# Patient Record
Sex: Male | Born: 1970 | Race: White | Hispanic: No | Smoking: Never smoker
Health system: Southern US, Community
[De-identification: ages and names within clinical notes are randomized; demographics above are authoritative.]

## PROBLEM LIST (undated history)

## (undated) DIAGNOSIS — E7800 Pure hypercholesterolemia, unspecified: Secondary | ICD-10-CM

## (undated) DIAGNOSIS — R748 Abnormal levels of other serum enzymes: Secondary | ICD-10-CM

## (undated) DIAGNOSIS — Z87442 Personal history of urinary calculi: Secondary | ICD-10-CM

## (undated) DIAGNOSIS — F32A Depression, unspecified: Secondary | ICD-10-CM

## (undated) DIAGNOSIS — R7303 Prediabetes: Secondary | ICD-10-CM

## (undated) DIAGNOSIS — R7989 Other specified abnormal findings of blood chemistry: Secondary | ICD-10-CM

## (undated) DIAGNOSIS — N200 Calculus of kidney: Secondary | ICD-10-CM

## (undated) DIAGNOSIS — J189 Pneumonia, unspecified organism: Secondary | ICD-10-CM

## (undated) DIAGNOSIS — F329 Major depressive disorder, single episode, unspecified: Secondary | ICD-10-CM

## (undated) DIAGNOSIS — G4730 Sleep apnea, unspecified: Secondary | ICD-10-CM

## (undated) DIAGNOSIS — L409 Psoriasis, unspecified: Secondary | ICD-10-CM

## (undated) DIAGNOSIS — F419 Anxiety disorder, unspecified: Secondary | ICD-10-CM

## (undated) DIAGNOSIS — I10 Essential (primary) hypertension: Secondary | ICD-10-CM

## (undated) HISTORY — PX: CHOLECYSTECTOMY: SHX55

## (undated) HISTORY — DX: Pure hypercholesterolemia, unspecified: E78.00

## (undated) HISTORY — DX: Psoriasis, unspecified: L40.9

## (undated) HISTORY — DX: Depression, unspecified: F32.A

## (undated) HISTORY — PX: LITHOTRIPSY: SUR834

## (undated) HISTORY — DX: Sleep apnea, unspecified: G47.30

## (undated) HISTORY — DX: Calculus of kidney: N20.0

## (undated) HISTORY — PX: OTHER SURGICAL HISTORY: SHX169

## (undated) HISTORY — DX: Major depressive disorder, single episode, unspecified: F32.9

## (undated) HISTORY — DX: Other specified abnormal findings of blood chemistry: R79.89

---

## 1998-04-21 ENCOUNTER — Ambulatory Visit (HOSPITAL_COMMUNITY): Admission: RE | Admit: 1998-04-21 | Discharge: 1998-04-21 | Payer: Self-pay | Admitting: Gynecology

## 2001-01-21 ENCOUNTER — Encounter: Payer: Self-pay | Admitting: Gastroenterology

## 2001-01-21 ENCOUNTER — Ambulatory Visit (HOSPITAL_COMMUNITY): Admission: RE | Admit: 2001-01-21 | Discharge: 2001-01-21 | Payer: Self-pay | Admitting: Gastroenterology

## 2003-01-22 ENCOUNTER — Encounter: Payer: Self-pay | Admitting: Family Medicine

## 2003-01-22 ENCOUNTER — Emergency Department (HOSPITAL_COMMUNITY): Admission: RE | Admit: 2003-01-22 | Discharge: 2003-01-23 | Payer: Self-pay | Admitting: Family Medicine

## 2003-03-11 ENCOUNTER — Ambulatory Visit (HOSPITAL_BASED_OUTPATIENT_CLINIC_OR_DEPARTMENT_OTHER): Admission: RE | Admit: 2003-03-11 | Discharge: 2003-03-11 | Payer: Self-pay | Admitting: Urology

## 2003-06-04 ENCOUNTER — Encounter: Admission: RE | Admit: 2003-06-04 | Discharge: 2003-09-02 | Payer: Self-pay | Admitting: Psychology

## 2004-10-03 ENCOUNTER — Ambulatory Visit: Payer: Self-pay

## 2006-04-17 HISTORY — PX: URETEROLITHOTOMY: SHX71

## 2007-03-23 ENCOUNTER — Emergency Department (HOSPITAL_COMMUNITY): Admission: EM | Admit: 2007-03-23 | Discharge: 2007-03-23 | Payer: Self-pay | Admitting: Emergency Medicine

## 2007-03-28 ENCOUNTER — Ambulatory Visit (HOSPITAL_COMMUNITY): Admission: RE | Admit: 2007-03-28 | Discharge: 2007-03-28 | Payer: Self-pay | Admitting: Urology

## 2007-10-29 ENCOUNTER — Emergency Department (HOSPITAL_COMMUNITY): Admission: EM | Admit: 2007-10-29 | Discharge: 2007-10-29 | Payer: Self-pay | Admitting: Emergency Medicine

## 2010-08-08 ENCOUNTER — Emergency Department (HOSPITAL_COMMUNITY)
Admission: EM | Admit: 2010-08-08 | Discharge: 2010-08-08 | Disposition: A | Discharge status: Home / Self Care | Payer: Managed Care, Other (non HMO) | Attending: Emergency Medicine | Admitting: Emergency Medicine

## 2010-08-08 DIAGNOSIS — M545 Low back pain, unspecified: Secondary | ICD-10-CM | POA: Insufficient documentation

## 2010-08-08 DIAGNOSIS — M62838 Other muscle spasm: Secondary | ICD-10-CM | POA: Insufficient documentation

## 2010-08-08 DIAGNOSIS — X58XXXA Exposure to other specified factors, initial encounter: Secondary | ICD-10-CM | POA: Insufficient documentation

## 2010-08-08 DIAGNOSIS — T148XXA Other injury of unspecified body region, initial encounter: Secondary | ICD-10-CM | POA: Insufficient documentation

## 2010-08-08 DIAGNOSIS — G4730 Sleep apnea, unspecified: Secondary | ICD-10-CM | POA: Insufficient documentation

## 2010-08-08 DIAGNOSIS — Z87442 Personal history of urinary calculi: Secondary | ICD-10-CM | POA: Insufficient documentation

## 2010-08-08 DIAGNOSIS — E7800 Pure hypercholesterolemia, unspecified: Secondary | ICD-10-CM | POA: Insufficient documentation

## 2010-08-30 ENCOUNTER — Encounter: Payer: Self-pay | Admitting: Cardiovascular Disease

## 2010-09-02 NOTE — Op Note (Signed)
NAME:  Jonathan Ballard, Jonathan Ballard                      ACCOUNT NO.:  [id number]   MEDICAL RECORD NO.:  [id number]                   PATIENT TYPE:  AMB   LOCATION:  [facility]                                 FACILITY:  [facility]   PHYSICIAN:  Bertram Millard. Dahlstedt, M.D.          DATE OF BIRTH:  10-16-1970   DATE OF PROCEDURE:  03/11/2003  DATE OF DISCHARGE:                                 OPERATIVE REPORT   PREOPERATIVE DIAGNOSIS:  Right ureteral calculus.   POSTOPERATIVE DIAGNOSIS:  Right ureteral calculus.   PRINCIPAL PROCEDURE:  Right ureteroscopic stone extraction.   SURGEON:  Bertram Millard. Dahlstedt, M.D.   FIRST ASSISTANT:  Susanne Borders, M.D.   ANESTHESIA:  General with LMA.   COMPLICATIONS:  None.   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  He has had intermittent right flank pain.  Over  the past week or so this pain has been getting worse.  He has been on  Vicodin almost every day.  He recently presented to my office and requested  that we do something about this stone.  We had talked about treatment  options including lithotripsy versus ureteroscopic stone extraction.  This  is a distal ureteral stone, and I feel it is more amenable to extraction  rather than lithotripsy.  Risks and complications of this procedure were  discussed with the patient.  He understands these and desires to proceed.   DESCRIPTION OF PROCEDURE:  The patient was administered preoperative IV  antibiotics and taken to the operating room, where a general anesthetic was  administered using the LMA.  He was placed in the dorsal lithotomy position.  Genitalia and perineum were cleansed and draped.  A 6 French ureteroscope  was passed directly through his urethra and into his right ureter.  The  scope was advanced through the ureteral orifice up to the stone, which was  then encountered, grasped with a nitinol basket, and extracted.  The ureter  was then again  inspected with the ureteroscope.  No further stones were  seen.  There was no significant ureteral spasm, and it was thought that we  would not leave a stent in place.  At this point the bladder was drained,  the scope removed.  The patient tolerated the procedure well.  He was  awakened and taken to the PACU in stable condition.   He will follow up in approximately one week.  Discharge medications include  the narcotic the patient already has at home plus Keflex.                                               Bertram Millard. Dahlstedt, M.D.    SMD/MEDQ  D:  03/11/2003  T:  03/11/2003  Job:  [id number]

## 2010-11-02 ENCOUNTER — Ambulatory Visit: Payer: Managed Care, Other (non HMO) | Admitting: Cardiovascular Disease

## 2010-11-07 ENCOUNTER — Encounter: Payer: Self-pay | Admitting: *Deleted

## 2010-11-07 ENCOUNTER — Encounter: Payer: Self-pay | Admitting: Cardiovascular Disease

## 2010-11-08 ENCOUNTER — Ambulatory Visit (INDEPENDENT_AMBULATORY_CARE_PROVIDER_SITE_OTHER): Payer: Managed Care, Other (non HMO) | Admitting: Cardiovascular Disease

## 2010-11-08 ENCOUNTER — Encounter: Payer: Self-pay | Admitting: Cardiovascular Disease

## 2010-11-08 VITALS — BP 121/69 | HR 80 | Resp 14

## 2010-11-08 DIAGNOSIS — E785 Hyperlipidemia, unspecified: Secondary | ICD-10-CM

## 2010-11-08 DIAGNOSIS — E786 Lipoprotein deficiency: Secondary | ICD-10-CM | POA: Insufficient documentation

## 2010-11-08 NOTE — Patient Instructions (Signed)
Your physician has requested that you have cardiac CT. Cardiac computed tomography (CT) is a painless test that uses an x-ray machine to take clear, detailed pictures of your heart. For further information please visit www.cardiosmart.org. Please follow instruction sheet as given.   

## 2010-11-08 NOTE — Assessment & Plan Note (Signed)
Increase aerobic activity.  Continue statin.  Refer to CETP trial for raising HDL.  Will see if insurance covers calcium scoring.  Continue Atkins diet

## 2010-11-08 NOTE — Progress Notes (Signed)
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  On statin for long time.  Rash to Niacin. HDL has been as low as 9 and usually in mid 20s now.  Has good understanding of diets.  Currently on induction phase of Atkins.  Has had kidney stones with [facility] before.  Fairly sedentary.  Discussed best way to increase HDL is to exercise.  No SSCP, palpitations, dyspnea or syncope.  Has had ETT at age 65 and 58 that were normal.  Would be a good candidate for CETP inhibitor trial. Spoke with Weston Brass or Pharm D and she will talk to the Pharm D at [place] [facility] who enrolls [place] patient in these trials.  No history of liver abnormalities.    Did discuss possiblility of calcium score to further assess possibility of CAD.  If his calcium score were high he may be more inclined to enter a CETP research trial  Labs:  08/31/10  LDL-P 1945 (<1000), HDL 30, Trigl 97 TC 145 LDL -C 96  ROS: Denies fever, malais, weight loss, blurry vision, decreased visual acuity, cough, sputum, SOB, hemoptysis, pleuritic pain, palpitaitons, heartburn, abdominal pain, melena, lower extremity edema, claudication, or rash.  All other systems reviewed and negative   General: Affect appropriate Healthy:  appears stated age HEENT: normal Neck supple with no adenopathy JVP normal no bruits no thyromegaly Lungs clear with no wheezing and good diaphragmatic motion Heart:  S1/S2 no murmur,rub, gallop or click PMI normal Abdomen: benighn, BS positve, no tenderness, no AAA no bruit.  No HSM or HJR Distal pulses intact with no bruits No edema Neuro non-focal Skin warm and dry Psoriatic lesions on legs No muscular weakness  Medications Current Outpatient Prescriptions  Medication Sig Dispense Refill  . aspirin 81 MG tablet Take 81 mg by mouth daily.        . Cholecalciferol (VITAMIN D3) 1000 UNITS CAPS Take 1 tablet by mouth daily.        Marland Kitchen docusate sodium (COLACE) 100 MG capsule Take 100 mg by mouth 2 (two)  times daily.        Marland Kitchen omega-3 acid ethyl esters (LOVAZA) 1 G capsule 3 tabs po qd       . rosuvastatin (CRESTOR) 20 MG tablet Take 20 mg by mouth daily.        Marland Kitchen zolpidem (AMBIEN) 10 MG tablet Take 10 mg by mouth at bedtime as needed.          Allergies Niacin and related; Penicillins; and Zithromax  Family History: No family history on file.  Social History: History   Social History  . Marital Status: Married    Spouse Name: N/A    Number of Children: N/A  . Years of Education: N/A   Occupational History  . Not on file.   Social History Main Topics  . Smoking status: Never Smoker   . Smokeless tobacco: Not on file  . Alcohol Use: Not on file  . Drug Use: Not on file  . Sexually Active: Not on file   Other Topics Concern  . Not on file   Social History Narrative  . No narrative on file    Electrocardiogram:  NSR 83 LAFB  Assessment and Plan

## 2010-11-14 ENCOUNTER — Ambulatory Visit (HOSPITAL_COMMUNITY)
Admission: RE | Admit: 2010-11-14 | Discharge: 2010-11-14 | Disposition: A | Discharge status: Home / Self Care | Payer: Managed Care, Other (non HMO) | Source: Ambulatory Visit | Attending: Cardiovascular Disease | Admitting: Cardiovascular Disease

## 2010-11-14 ENCOUNTER — Encounter (HOSPITAL_COMMUNITY): Payer: Self-pay

## 2010-11-14 DIAGNOSIS — E786 Lipoprotein deficiency: Secondary | ICD-10-CM

## 2010-11-14 DIAGNOSIS — E785 Hyperlipidemia, unspecified: Secondary | ICD-10-CM | POA: Insufficient documentation

## 2010-12-06 ENCOUNTER — Encounter: Payer: Self-pay | Admitting: Cardiovascular Disease

## 2011-01-12 LAB — URINALYSIS, ROUTINE W REFLEX MICROSCOPIC
Bilirubin Urine: NEGATIVE
Ketones, ur: NEGATIVE
Nitrite: POSITIVE — AB
Protein, ur: NEGATIVE
Specific Gravity, Urine: 1.016

## 2011-01-23 LAB — DIFFERENTIAL
Basophils Absolute: 0
Basophils Relative: 0
Eosinophils Absolute: 0 — ABNORMAL LOW
Lymphs Abs: 0.4 — ABNORMAL LOW
Monocytes Relative: 5
Neutrophils Relative %: 91 — ABNORMAL HIGH

## 2011-01-23 LAB — BASIC METABOLIC PANEL
CO2: 26
Chloride: 106
Creatinine, Ser: 1.19
GFR calc non Af Amer: >60
Sodium: 139

## 2011-01-23 LAB — URINE MICROSCOPIC-ADD ON

## 2011-01-23 LAB — URINE CULTURE

## 2011-01-23 LAB — URINALYSIS, ROUTINE W REFLEX MICROSCOPIC
Glucose, UA: NEGATIVE
Leukocytes, UA: NEGATIVE
Nitrite: NEGATIVE
Specific Gravity, Urine: 1.027
pH: 5

## 2011-01-23 LAB — CBC
HCT: 42.1
MCV: 88.7
RDW: 12.8

## 2011-05-05 ENCOUNTER — Ambulatory Visit (INDEPENDENT_AMBULATORY_CARE_PROVIDER_SITE_OTHER): Payer: Managed Care, Other (non HMO)

## 2011-05-05 DIAGNOSIS — K5900 Constipation, unspecified: Secondary | ICD-10-CM

## 2011-05-05 DIAGNOSIS — R1084 Generalized abdominal pain: Secondary | ICD-10-CM

## 2011-05-26 ENCOUNTER — Telehealth: Payer: Self-pay

## 2011-05-26 NOTE — Telephone Encounter (Signed)
.  [facility] PT STATES HE IS TO GO SEE AN UROLOGIST ON Monday AND THEY ARE IN NEED OF HIS XRAYS. FORGOT THE NAME OF THE PLACE, BUT HE THINK IT'S ON [address]. PLEASE CALL PT AT [phone]

## 2011-05-26 NOTE — Telephone Encounter (Signed)
X-rays copied for pick up. Patient notified.

## 2011-05-31 ENCOUNTER — Other Ambulatory Visit: Payer: Self-pay | Admitting: Urology

## 2011-06-14 ENCOUNTER — Encounter (HOSPITAL_COMMUNITY): Payer: Self-pay | Admitting: *Deleted

## 2011-06-14 NOTE — Progress Notes (Signed)
Pt reminded no aspirin products 4 days prior to ESWL and to take laxative 06/18/11 pm

## 2011-06-16 ENCOUNTER — Encounter (HOSPITAL_COMMUNITY): Payer: Self-pay | Admitting: Pharmacy Technician

## 2011-06-18 NOTE — H&P (Signed)
Urology Admission H&P  Chief Complaint: Kidney stone    History of Present Illness: 41 year old male originally presented in mid January with a 10 day history of left flank pain. He was found to have a 5 mm left mid ureteral stone, which at his last visit approximately 2 weeks ago had not progressed distally despite MET. He presents now for ESL of the stone.  Past Medical History  Diagnosis Date  . Hypercholesteremia   . Kidney stone   . Sleep apnea     uses C-PAP   Past Surgical History  Procedure Date  . Lithotripsy   . Cholecystectomy   . Ureterolithotomy 2008    Home Medications:  No prescriptions prior to admission   Allergies:  Allergies  Allergen Reactions  . Zithromax (Azithromycin Dihydrate) Other (See Comments)    Reaction=tongue swells  . Niacin And Related Rash  . Niaspan (Niacin (Antihyperlipidemic)) Rash  . Penicillins Rash    No family history on file. Social History:  reports that he has never smoked. He does not have any smokeless tobacco history on file. His alcohol and drug histories not on file.  Review of Systems  Gastrointestinal: Positive for constipation.       Bloating  All other systems reviewed and are negative.    Physical Exam:  Vital signs in last 24 hours:   Physical Exam Physical Exam Constitutional: Well nourished and well developed . No acute distress.  ENT:. The ears and nose are normal in appearance.  Neck: The appearance of the neck is normal and no neck mass is present.  Pulmonary: No respiratory distress and normal respiratory rhythm and effort . No accessory muscle use.  Abdomen: The abdomen is rounded. No masses are palpated. Mild tenderness in the LLQ is present. mild left CVA tenderness. No hernias are palpable. No hepatosplenomegaly noted.  Lymphatics: The femoral and inguinal nodes are not enlarged or tender.  Skin: Normal skin turgor, no visible rash and no visible skin lesions.     Laboratory Data:  No results  found for this or any previous visit (from the past 24 hour(s)). No results found for this or any previous visit (from the past 240 hour(s)). Creatinine: No results found for this basename: CREATININE:7 in the last 168 hours Baseline Creatinine:  Impression/Assessment:  Persistent 5 mm left mid ureteral stone.  Plan:  ESL of left mid ureteral stone  Masin Shatto M 06/18/2011, 7:10 PM

## 2011-06-19 ENCOUNTER — Ambulatory Visit (HOSPITAL_COMMUNITY)
Admission: RE | Admit: 2011-06-19 | Discharge: 2011-06-19 | Disposition: A | Discharge status: Home / Self Care | Payer: Managed Care, Other (non HMO) | Source: Ambulatory Visit | Attending: Urology | Admitting: Urology

## 2011-06-19 ENCOUNTER — Encounter (HOSPITAL_COMMUNITY)
Admission: RE | Disposition: A | Discharge status: Home / Self Care | Payer: Self-pay | Source: Ambulatory Visit | Attending: Urology

## 2011-06-19 ENCOUNTER — Encounter (HOSPITAL_COMMUNITY): Payer: Self-pay | Admitting: *Deleted

## 2011-06-19 ENCOUNTER — Ambulatory Visit (HOSPITAL_COMMUNITY): Payer: Managed Care, Other (non HMO)

## 2011-06-19 DIAGNOSIS — K5900 Constipation, unspecified: Secondary | ICD-10-CM | POA: Insufficient documentation

## 2011-06-19 DIAGNOSIS — E7800 Pure hypercholesterolemia, unspecified: Secondary | ICD-10-CM | POA: Insufficient documentation

## 2011-06-19 DIAGNOSIS — N201 Calculus of ureter: Secondary | ICD-10-CM | POA: Insufficient documentation

## 2011-06-19 SURGERY — LITHOTRIPSY, ESWL
Anesthesia: LOCAL | Laterality: Left

## 2011-06-19 MED ORDER — SODIUM CHLORIDE 0.9 % IJ SOLN: 3.0000 mL | INTRAMUSCULAR | Status: DC | PRN

## 2011-06-19 MED ORDER — DIAZEPAM 5 MG PO TABS
10.0000 mg | ORAL_TABLET | ORAL | Status: AC
  Administered 2011-06-19: 10 mg via ORAL

## 2011-06-19 MED ORDER — DEXTROSE-NACL 5-0.45 % IV SOLN
INTRAVENOUS | Status: DC
  Administered 2011-06-19: 08:00:00 via INTRAVENOUS

## 2011-06-19 MED ORDER — ACETAMINOPHEN 325 MG PO TABS: 650.0000 mg | ORAL_TABLET | ORAL | Status: DC | PRN

## 2011-06-19 MED ORDER — ACETAMINOPHEN 650 MG RE SUPP
650.0000 mg | RECTAL | Status: DC | PRN
  Filled 2011-06-19: qty 1

## 2011-06-19 MED ORDER — DIPHENHYDRAMINE HCL 25 MG PO CAPS
ORAL_CAPSULE | ORAL | Status: AC
  Administered 2011-06-19: 25 mg via ORAL
  Filled 2011-06-19: qty 1

## 2011-06-19 MED ORDER — ONDANSETRON HCL 4 MG/2ML IJ SOLN: 4.0000 mg | Freq: Four times a day (QID) | INTRAMUSCULAR | Status: DC | PRN

## 2011-06-19 MED ORDER — OXYCODONE HCL 5 MG PO TABS: 5.0000 mg | ORAL_TABLET | ORAL | Status: DC | PRN

## 2011-06-19 MED ORDER — MORPHINE SULFATE 10 MG/ML IJ SOLN: 2.0000 mg | INTRAMUSCULAR | Status: DC | PRN

## 2011-06-19 MED ORDER — SODIUM CHLORIDE 0.9 % IV SOLN: 250.0000 mL | INTRAVENOUS | Status: DC | PRN

## 2011-06-19 MED ORDER — SODIUM CHLORIDE 0.9 % IJ SOLN: 3.0000 mL | Freq: Two times a day (BID) | INTRAMUSCULAR | Status: DC

## 2011-06-19 MED ORDER — DIPHENHYDRAMINE HCL 25 MG PO CAPS
25.0000 mg | ORAL_CAPSULE | ORAL | Status: AC
  Administered 2011-06-19: 25 mg via ORAL

## 2011-06-19 MED ORDER — DIAZEPAM 5 MG PO TABS
ORAL_TABLET | ORAL | Status: AC
  Administered 2011-06-19: 10 mg via ORAL
  Filled 2011-06-19: qty 2

## 2011-06-19 NOTE — Op Note (Signed)
See [facility] OP note scanned into chart. 

## 2011-06-19 NOTE — Progress Notes (Signed)
Left flank reddened with out breakdown post lithotripsy

## 2011-06-20 ENCOUNTER — Encounter (HOSPITAL_COMMUNITY): Payer: Self-pay

## 2011-07-24 ENCOUNTER — Other Ambulatory Visit: Payer: Self-pay | Admitting: Emergency Medicine

## 2011-09-03 ENCOUNTER — Other Ambulatory Visit: Payer: Self-pay | Admitting: Emergency Medicine

## 2011-09-12 ENCOUNTER — Ambulatory Visit (INDEPENDENT_AMBULATORY_CARE_PROVIDER_SITE_OTHER): Payer: Managed Care, Other (non HMO) | Admitting: Emergency Medicine

## 2011-09-12 VITALS — BP 110/70 | HR 62 | Temp 98.0°F | Resp 16 | Ht 64.0 in | Wt 174.4 lb

## 2011-09-12 DIAGNOSIS — E785 Hyperlipidemia, unspecified: Secondary | ICD-10-CM | POA: Insufficient documentation

## 2011-09-12 DIAGNOSIS — L408 Other psoriasis: Secondary | ICD-10-CM

## 2011-09-12 DIAGNOSIS — G4700 Insomnia, unspecified: Secondary | ICD-10-CM

## 2011-09-12 DIAGNOSIS — E782 Mixed hyperlipidemia: Secondary | ICD-10-CM

## 2011-09-12 DIAGNOSIS — Z0000 Encounter for general adult medical examination without abnormal findings: Secondary | ICD-10-CM

## 2011-09-12 DIAGNOSIS — M109 Gout, unspecified: Secondary | ICD-10-CM | POA: Insufficient documentation

## 2011-09-12 DIAGNOSIS — R6882 Decreased libido: Secondary | ICD-10-CM

## 2011-09-12 DIAGNOSIS — N529 Male erectile dysfunction, unspecified: Secondary | ICD-10-CM

## 2011-09-12 DIAGNOSIS — N200 Calculus of kidney: Secondary | ICD-10-CM

## 2011-09-12 LAB — COMPREHENSIVE METABOLIC PANEL
AST: 25 U/L (ref 0–37)
Albumin: 4.4 g/dL (ref 3.5–5.2)
BUN: 14 mg/dL (ref 6–23)
Calcium: 9 mg/dL (ref 8.4–10.5)
Chloride: 106 mEq/L (ref 96–112)
Glucose, Bld: 79 mg/dL (ref 70–99)
Potassium: 4.5 mEq/L (ref 3.5–5.3)
Sodium: 140 mEq/L (ref 135–145)
Total Protein: 6.7 g/dL (ref 6.0–8.3)

## 2011-09-12 LAB — CBC WITH DIFFERENTIAL/PLATELET
Basophils Absolute: 0 10*3/uL (ref 0.0–0.1)
HCT: 43 % (ref 39.0–52.0)
Hemoglobin: 14.3 g/dL (ref 13.0–17.0)
Lymphocytes Relative: 21 % (ref 12–46)
Monocytes Absolute: 0.3 10*3/uL (ref 0.1–1.0)
Monocytes Relative: 6 % (ref 3–12)
Neutro Abs: 3.7 10*3/uL (ref 1.7–7.7)
Neutrophils Relative %: 70 % (ref 43–77)
WBC: 5.3 10*3/uL (ref 4.0–10.5)

## 2011-09-12 LAB — POCT UA - MICROSCOPIC ONLY

## 2011-09-12 LAB — POCT URINALYSIS DIPSTICK
Bilirubin, UA: NEGATIVE
Glucose, UA: NEGATIVE
Ketones, UA: NEGATIVE
Leukocytes, UA: NEGATIVE
Nitrite, UA: NEGATIVE

## 2011-09-12 MED ORDER — OMEGA-3-ACID ETHYL ESTERS 1 G PO CAPS: 1.0000 g | ORAL_CAPSULE | Freq: Three times a day (TID) | ORAL | Status: DC

## 2011-09-12 MED ORDER — ROSUVASTATIN CALCIUM 20 MG PO TABS: 20.0000 mg | ORAL_TABLET | ORAL | Status: DC

## 2011-09-12 NOTE — Progress Notes (Signed)
  Subjective:    Patient ID: Jonathan Ballard, male    DOB: 08/11/1970, 41 y.o.   MRN: [id number]  HPI patient enters for his yearly physical his main problem although as she has been battling with kidney stones. He has a urologist who she is and regular.    Review of Systems  Constitutional: Negative.   HENT: Negative.   Eyes: Negative.   Respiratory: Negative.   Cardiovascular:       Patient has undergone stress Cardiolite since June 2006 as well as December 2010.  Gastrointestinal: Negative.   Genitourinary:       Patient has a history of recurrent kidney stones he is better with these off and on over the last 3-4 months. He is under the care of Dr. Retta Diones for this. He also has had significant decrease in his libido and would like this checked.  Musculoskeletal: Negative.   Neurological: Negative.   Hematological: Negative.   Psychiatric/Behavioral: Negative.  Agitation:          Objective:   Physical Exam  Constitutional: He appears well-developed and well-nourished.  HENT:  Head: Normocephalic.  Right Ear: External ear normal.  Left Ear: External ear normal.  Eyes: Pupils are equal, round, and reactive to light.  Neck: No JVD present. No thyromegaly present.  Cardiovascular: Normal rate and regular rhythm.  Exam reveals no gallop and no friction rub.   No murmur heard. Pulmonary/Chest: Effort normal and breath sounds normal. No respiratory distress. He has no wheezes. He has no rales. He exhibits no tenderness.  Musculoskeletal: Normal range of motion.  Lymphadenopathy:    He has no cervical adenopathy.  Neurological: He is alert. No cranial nerve deficit. Coordination normal.  Skin: Skin is warm and dry.  Psychiatric: He has a normal mood and affect. His behavior is normal.    EKG LAD      Assessment & Plan:  Major problem with Jonathan Ballard has been his hyperlipidemia. We will check a lipid panel as well as make referral to a nutritionist to see if they can help  with his diet

## 2011-09-13 LAB — TESTOSTERONE, FREE, TOTAL, SHBG: Sex Hormone Binding: 13 nmol/L (ref 13–71)

## 2011-09-13 LAB — URIC ACID: Uric Acid, Serum: 7.5 mg/dL (ref 4.0–7.8)

## 2011-09-21 ENCOUNTER — Telehealth: Payer: Self-pay

## 2011-09-21 NOTE — Telephone Encounter (Signed)
Dr. Cleta Alberts, Huston Foley called back and wants to know if he can try the Axiron because we have a coupon for it. Can you please e'rx to his pharm [facility] on [address]) and send this back to the pool so we can call him back and let him know we did this. Also he says he needs a RF of his Ambien. Thanks

## 2011-09-22 NOTE — Telephone Encounter (Signed)
Please call in to Axiron apply to pumps daily dispense quantity sufficient for 6 month. Also call in ambien  10 mg one half to one at bedtime dispense #30 with 5 refills. Advise bradycardia we need to check a testosterone level and PSA in 3-6

## 2011-09-22 NOTE — Telephone Encounter (Signed)
Called both Rxs into pharm and notified pt. Pt agreed to RTC in 6 mos for re-check, or sooner if he feels there might be a problem.

## 2011-11-02 ENCOUNTER — Encounter: Payer: Self-pay | Admitting: *Deleted

## 2011-11-02 ENCOUNTER — Encounter: Payer: Managed Care, Other (non HMO) | Attending: Emergency Medicine | Admitting: *Deleted

## 2011-11-02 VITALS — Ht 64.0 in | Wt 179.4 lb

## 2011-11-02 DIAGNOSIS — E786 Lipoprotein deficiency: Secondary | ICD-10-CM

## 2011-11-02 DIAGNOSIS — N200 Calculus of kidney: Secondary | ICD-10-CM | POA: Insufficient documentation

## 2011-11-02 DIAGNOSIS — E785 Hyperlipidemia, unspecified: Secondary | ICD-10-CM | POA: Insufficient documentation

## 2011-11-02 DIAGNOSIS — Z713 Dietary counseling and surveillance: Secondary | ICD-10-CM | POA: Insufficient documentation

## 2011-11-02 NOTE — Patient Instructions (Addendum)
Follow recommendations for Mediterranean  Diet 64 oz water a day Aim for 45 minutes moderate intensity exercise on days off Follow MyPlate recommedentations for meal planning Limit nuts to 1 oz servings

## 2011-11-02 NOTE — Progress Notes (Signed)
  Medical Nutrition Therapy:  Appt start time: 0900 end time:  1000.   Assessment:  Primary concerns today: hyperlipidemia.  Patient also suffers from kidney stones and has family history of diabetes.  He is obese and is very concerned about his risk for diabetes and has often followed a low -carb diet until recently when that diet exacerbated his kidney stones.  He feels he is carbohydrate sensitive and does not want to consume much carbohydrates for feal of weight gain   MEDICATIONS: see list   DIETARY INTAKE:  Usual eating pattern includes 3 meals and ad lib snacks per day.  Everyday foods include starches, meats, nuts, vegetables.  Avoided foods include high glycemia index foods.    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[employer], with egg whites and chicken and pepperoni or imitation crab meat with ranch or oil and vinegar; days off has low carb wrap Snk ( PM): when working-unsalted nuts with dried fruit ; snacks all day at home on apple or cookies D ( PM): could be later if working-sandwich (roast beef or chicken salad); cereal or whatever is in fridge; day off has salad with chicken or fish; or pasta with no veggies- may have fruit Snk ( PM): none when working; cereal or chex mix or pretzels with cream cheese or peanut butter when off Beverages: coke zero, 20 oz water  Usual physical activity: walks dog 15-45 min daily  Estimated energy needs: 1600 calories 180 g carbohydrates 120 g protein 44 g fat  Progress Towards Goal(s):  In progress.   Nutritional Diagnosis:  NB-1.1 Food and nutrition-related knowledge deficit As related to proper balace of carbohydrates, fats, and proteins.  As evidenced by obesity and hyperlipidemia.    Intervention:  Nutrition counseling  provided.  Discussed what foods have carbohydrate in them and how much carbohydrate he should consume.  Encouraged whole grains.  Discussed the Mediterranean diet for cholesterol reduction, lowering effects of gout, and for managing blood sugars.  Velma was very enthusiastic about the Mediterranean diet.  Encouraged more non-starchy vegetables.  Encouraged limiting the amount of nuts he consumes to reduce fat and calories. Encouraged more water to reduce risk of kidney stones.  Also encouraged daily moderate physical activity for weight reduction and increasing HDL  Handouts given during visit include:  Tips to reduce cholesterol  Reading food labels  Low-sodium flavoring tips  Mediterranean diet information  Monitoring/Evaluation:  Dietary intake, exercise, lipid panel, and body weight in a few month(s) after new blood test results come in.

## 2011-11-08 ENCOUNTER — Encounter: Payer: Self-pay | Admitting: Emergency Medicine

## 2012-01-09 ENCOUNTER — Encounter: Payer: Self-pay | Admitting: Family Medicine

## 2012-02-17 ENCOUNTER — Ambulatory Visit (INDEPENDENT_AMBULATORY_CARE_PROVIDER_SITE_OTHER): Payer: Managed Care, Other (non HMO) | Admitting: Family Medicine

## 2012-02-17 VITALS — BP 104/68 | HR 76 | Temp 98.2°F | Resp 16 | Ht 64.25 in | Wt 174.2 lb

## 2012-02-17 DIAGNOSIS — E291 Testicular hypofunction: Secondary | ICD-10-CM

## 2012-02-17 DIAGNOSIS — E785 Hyperlipidemia, unspecified: Secondary | ICD-10-CM

## 2012-02-17 LAB — COMPREHENSIVE METABOLIC PANEL
ALT: 46 U/L (ref 0–53)
AST: 26 U/L (ref 0–37)
Albumin: 4.3 g/dL (ref 3.5–5.2)
Alkaline Phosphatase: 74 U/L (ref 39–117)
BUN: 15 mg/dL (ref 6–23)
CO2: 26 mEq/L (ref 19–32)
Calcium: 9.3 mg/dL (ref 8.4–10.5)
Chloride: 105 mEq/L (ref 96–112)
Creat: 0.9 mg/dL (ref 0.50–1.35)
Glucose, Bld: 94 mg/dL (ref 70–99)
Potassium: 4.2 mEq/L (ref 3.5–5.3)
Sodium: 139 mEq/L (ref 135–145)
Total Bilirubin: 0.7 mg/dL (ref 0.3–1.2)
Total Protein: 6.7 g/dL (ref 6.0–8.3)

## 2012-02-17 LAB — POCT CBC
Granulocyte percent: 73.9 %G (ref 37–80)
HCT, POC: 50.5 % (ref 43.5–53.7)
Hemoglobin: 15.2 g/dL (ref 14.1–18.1)
Lymph, poc: 1.3 (ref 0.6–3.4)
MCH, POC: 27.6 pg (ref 27–31.2)
MCHC: 30.1 g/dL — AB (ref 31.8–35.4)
MCV: 91.9 fL (ref 80–97)
MID (cbc): 0.4 (ref 0–0.9)
MPV: 9.1 fL (ref 0–99.8)
POC Granulocyte: 4.7 (ref 2–6.9)
POC LYMPH PERCENT: 20.4 %L (ref 10–50)
POC MID %: 5.7 %M (ref 0–12)
Platelet Count, POC: 299 10*3/uL (ref 142–424)
RBC: 5.5 M/uL (ref 4.69–6.13)
RDW, POC: 14.9 %
WBC: 6.3 10*3/uL (ref 4.6–10.2)

## 2012-02-17 LAB — TESTOSTERONE: Testosterone: 253.04 ng/dL — ABNORMAL LOW (ref 300–890)

## 2012-02-17 NOTE — Progress Notes (Signed)
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  It took 2.5 months before the testosterone started to work.  Now energy levels are good.  He has been having perineal pain x 3-4 days (following sex)  Objective:  Alert Chest:  Clear Heart:  Reg, no murmur or gallop Abdomen:  Soft, nontender, no HSM Perineum:  Normal Anus: normal Rectal:  Normal, normal prostate exam  Assessment:  Perineal strain?  Need for follow up.  Plan: 1. Hyperlipidemia  POCT CBC, Comprehensive metabolic panel, Testosterone, PSA  2. Hypogonadism male  PSA

## 2012-02-18 ENCOUNTER — Other Ambulatory Visit: Payer: Self-pay | Admitting: Family Medicine

## 2012-02-18 DIAGNOSIS — E291 Testicular hypofunction: Secondary | ICD-10-CM

## 2012-02-19 ENCOUNTER — Other Ambulatory Visit: Payer: Self-pay | Admitting: Family Medicine

## 2012-02-19 DIAGNOSIS — R7989 Other specified abnormal findings of blood chemistry: Secondary | ICD-10-CM

## 2012-02-19 DIAGNOSIS — E291 Testicular hypofunction: Secondary | ICD-10-CM

## 2012-02-19 LAB — PSA: PSA: 0.53 ng/mL (ref <=4.00)

## 2012-02-20 ENCOUNTER — Telehealth: Payer: Self-pay

## 2012-02-20 NOTE — Telephone Encounter (Signed)
This is a continuation of my initial note on Jonathan Ballard. The other option would be to double your dose of axiron. The other option would be to switch you to the injectable if like him and discuss about options that would be fine to give me a call back and see which of these options sound reasonable to you

## 2012-02-20 NOTE — Telephone Encounter (Signed)
PATIENT CALLED WANTING TO KNOW IF DAUB COULD LOOK OVER TESTOSTERONE RESULTS FOR A SECOND OPINION BECAUSE THEY CAME BACK ABNORMAL. BEST NUMBER TO REACH PATIENT IS AT WORK [phone]. THANK YOU!

## 2012-02-20 NOTE — Telephone Encounter (Signed)
your last testosterone was 279 this was 253 done recently. If you have having significant problems with decreased libido we may want to make some changes. We'll do like to see one of the urologists and discuss treatment options prior to making a change.

## 2012-02-21 NOTE — Telephone Encounter (Signed)
I called patient, left message on home number for him to call back.

## 2012-02-22 ENCOUNTER — Telehealth: Payer: Self-pay | Admitting: Emergency Medicine

## 2012-02-22 ENCOUNTER — Other Ambulatory Visit: Payer: Self-pay | Admitting: Emergency Medicine

## 2012-02-22 DIAGNOSIS — E291 Testicular hypofunction: Secondary | ICD-10-CM

## 2012-02-22 NOTE — Telephone Encounter (Signed)
Pt is concerned about his testosterone level dropping since last time even w/medication and questions what could be causing this, and if he needs to see an endocrinologist? If not and he just needs to change his tx, does Dr Cleta Alberts recommend doubling his Axiron or changing to the injections, and how often would he have to do the injections? Pt reported that at first the Axiron seemed to be helping his Sxs, but then Sxs came back to same level as before or even a little worse. Please advise.

## 2012-02-22 NOTE — Telephone Encounter (Signed)
Pt returning call and would also like to talk with Dr. Cleta Alberts about his first set of labs that were done and the most recent lab as well. [phone]

## 2012-02-25 NOTE — Telephone Encounter (Signed)
Talk to patient he is being referred to endocrinology.

## 2012-02-27 ENCOUNTER — Encounter: Payer: Self-pay | Admitting: Family Medicine

## 2012-03-04 ENCOUNTER — Ambulatory Visit (INDEPENDENT_AMBULATORY_CARE_PROVIDER_SITE_OTHER): Payer: Managed Care, Other (non HMO) | Admitting: Endocrinology

## 2012-03-04 ENCOUNTER — Encounter: Payer: Self-pay | Admitting: Endocrinology

## 2012-03-04 VITALS — BP 142/80 | HR 100 | Temp 97.5°F | Wt 176.0 lb

## 2012-03-04 DIAGNOSIS — L409 Psoriasis, unspecified: Secondary | ICD-10-CM

## 2012-03-04 DIAGNOSIS — E291 Testicular hypofunction: Secondary | ICD-10-CM

## 2012-03-04 DIAGNOSIS — L408 Other psoriasis: Secondary | ICD-10-CM

## 2012-03-04 MED ORDER — CLOMIPHENE CITRATE 50 MG PO TABS: ORAL_TABLET | ORAL | Status: DC

## 2012-03-04 NOTE — Progress Notes (Signed)
Subjective:    Patient ID: Jonathan Ballard, male    DOB: January 31, 1971, 41 y.o.   MRN: [id number]  HPI Pt states 8 mos of moderate (10 lbs) weight gain, worst at the abdomen, and assoc fatigue.  He was found to have a low testosterone, and was rx'ed with axiron.  Despite this rx, sxs improved only transiently, and testosterone level stayed low.  He has since doubled the axiron.  When he goes on the [place] diet, he can lost weight, but he gets recurrent urolithiasis.  He has 2 children, and he has no h/o infertility.   Past Medical History  Diagnosis Date  . Hypercholesteremia   . Kidney stone   . Sleep apnea     uses C-PAP  . Low testosterone     Past Surgical History  Procedure Date  . Lithotripsy   . Cholecystectomy   . Ureterolithotomy 2008    History   Social History  . Marital Status: Married    Spouse Name: N/A    Number of Children: N/A  . Years of Education: N/A   Occupational History  . Not on file.   Social History Main Topics  . Smoking status: Never Smoker   . Smokeless tobacco: Not on file  . Alcohol Use: No  . Drug Use: No  . Sexually Active: Yes   Other Topics Concern  . Not on file   Social History Narrative  . No narrative on file    Current Outpatient Prescriptions on File Prior to Visit  Medication Sig Dispense Refill  . aspirin 81 MG tablet Take 81 mg by mouth every morning.       . calcipotriene-betamethasone (TACLONEX) ointment Apply topically daily.      . Cholecalciferol (VITAMIN D3) 1000 UNITS CAPS Take 5,000 Units by mouth every morning.       . clobetasol (TEMOVATE) 0.05 % external solution Apply 1 application topically 2 (two) times a week.       . Clobetasol Propionate (CLOBEX) 0.05 % shampoo Apply topically 2 (two) times a week.       . docusate sodium (COLACE) 100 MG capsule Take 100 mg by mouth 2 (two) times daily.        . fluocinonide (LIDEX) 0.05 % external solution Apply topically as needed.       . hydrocortisone  valerate cream (WESTCORT) 0.2 % Apply topically 2 (two) times daily.      Marland Kitchen ketorolac (TORADOL) 10 MG tablet Take 10 mg by mouth every 6 (six) hours as needed.      . lactulose, encephalopathy, (CHRONULAC) 10 GM/15ML SOLN Take by mouth as needed.      Marland Kitchen omega-3 acid ethyl esters (LOVAZA) 1 G capsule Take 1 capsule (1 g total) by mouth 3 (three) times daily.  90 capsule  11  . Probiotic Product (PROBIOTIC DAILY PO) Take by mouth.      . rosuvastatin (CRESTOR) 20 MG tablet Take 1 tablet (20 mg total) by mouth every morning.  30 tablet  11  . Salicylic Acid (SALEX) 6 % SHAM Apply topically.      . triamcinolone (KENALOG) topical spray Apply topically 2 (two) times daily.      Marland Kitchen zolpidem (AMBIEN) 10 MG tablet TAKE 1/2  TABLET BY MOUTH AT BEDTIME AS NEEDED  15 tablet  0    Allergies  Allergen Reactions  . Zithromax (Azithromycin Dihydrate) Other (See Comments)    Reaction=tongue swells  . Niacin And Related Rash  .  Niaspan (Niacin Er) Rash  . Penicillins Rash    Family History  Problem Relation Age of Onset  . Diabetes Father   . Bladder Cancer Father   . Hypothyroidism Father   . Cancer Other   . Hyperlipidemia Other   . Breast cancer Mother   . Hypothyroidism Mother   . ADD / ADHD Son   . Anxiety disorder Son     BP 142/80  Pulse 100  Temp 97.5 F (36.4 C) (Oral)  Wt 176 lb (79.833 kg)  SpO2 96%   Review of Systems denies polyuria, depression, numbness, erectile dysfunction, decreased urinary stream,  gynecomastia, muscle weakness, fever, headache, easy bruising, sob, rash, blurry vision, rhinorrhea, chest pain.   He has decreased libido.     Objective:   Physical Exam VS: see vs page GEN: no distress HEAD: head: no deformity eyes: no periorbital swelling, no proptosis external nose and ears are normal mouth: no lesion seen. NECK: supple, thyroid is not enlarged. CHEST WALL: no deformity. LUNGS: clear to auscultation. BREASTS:  No gynecomastia. CV: reg rate and  rhythm, no murmur. ABD: abdomen is soft, nontender.  no hepatosplenomegaly.  not distended.  no hernia GENITALIA:  Normal male.  MUSCULOSKELETAL: muscle bulk and strength are normal.  no obvious joint swelling.  gait is normal and steady.   EXTEMITIES: no deformity.  no edema PULSES: dorsalis pedis intact bilat.   NEURO:  cn 2-12 grossly intact.   readily moves all 4's.  sensation is intact to touch on the feet SKIN:  Normal texture and temperature.  No rash or suspicious lesion is visible.  Mild acne on the back.  Normal hair distribution.   NODES:  None palpable at the neck.   PSYCH: alert, oriented x3.  Does not appear anxious nor depressed.  Lab Results  Component Value Date   TESTOSTERONE 253.04* 02/17/2012      Assessment & Plan:  Idiopathic hypogonadism, prob central, needs increased rx Weight gain.  This can worsen the hypogonadism. Low HDL--this could be exac by normalization of testosterone

## 2012-03-04 NOTE — Patient Instructions (Addendum)
Try changing the axiron to clomiphine, 1/2 pill per day.  Please recheck the blood tests in 6 weeks (testosterone and prolactin), with an appointment a few days later.  normalization of testosterone is not known to harm you.  however, there are "theoretical" risks, including increased fertility, hair loss, prostate cancer, benign prostate enlargement, fluid retention, blood clots, liver problems, lower hdl ("good cholesterol"), sleep apnea, and behavior changes

## 2012-03-07 DIAGNOSIS — E291 Testicular hypofunction: Secondary | ICD-10-CM | POA: Insufficient documentation

## 2012-03-13 ENCOUNTER — Other Ambulatory Visit (INDEPENDENT_AMBULATORY_CARE_PROVIDER_SITE_OTHER): Payer: Managed Care, Other (non HMO)

## 2012-03-13 DIAGNOSIS — E236 Other disorders of pituitary gland: Secondary | ICD-10-CM

## 2012-03-13 LAB — PROLACTIN: Prolactin: 4.2 ng/mL (ref 2.1–17.1)

## 2012-03-13 NOTE — Progress Notes (Signed)
Patient here for labs only. 

## 2012-04-05 ENCOUNTER — Telehealth: Payer: Self-pay | Admitting: Family Medicine

## 2012-04-05 ENCOUNTER — Ambulatory Visit (INDEPENDENT_AMBULATORY_CARE_PROVIDER_SITE_OTHER): Payer: Managed Care, Other (non HMO) | Admitting: Family Medicine

## 2012-04-05 ENCOUNTER — Ambulatory Visit: Payer: Managed Care, Other (non HMO)

## 2012-04-05 VITALS — BP 146/79 | HR 80 | Temp 98.2°F | Resp 16 | Ht 63.5 in | Wt 177.0 lb

## 2012-04-05 DIAGNOSIS — K5900 Constipation, unspecified: Secondary | ICD-10-CM

## 2012-04-05 DIAGNOSIS — R109 Unspecified abdominal pain: Secondary | ICD-10-CM

## 2012-04-05 LAB — POCT CBC
HCT, POC: 52.9 % (ref 43.5–53.7)
Lymph, poc: 1.6 (ref 0.6–3.4)
MCH, POC: 28.6 pg (ref 27–31.2)
MCHC: 31 g/dL — AB (ref 31.8–35.4)
MCV: 92.2 fL (ref 80–97)
MID (cbc): 0.5 (ref 0–0.9)
POC LYMPH PERCENT: 18.3 %L (ref 10–50)
Platelet Count, POC: 306 10*3/uL (ref 142–424)
RDW, POC: 14.6 %

## 2012-04-05 LAB — POCT UA - MICROSCOPIC ONLY
Epithelial cells, urine per micros: NEGATIVE
Mucus, UA: NEGATIVE

## 2012-04-05 LAB — POCT URINALYSIS DIPSTICK
Bilirubin, UA: NEGATIVE
Leukocytes, UA: NEGATIVE
Spec Grav, UA: 1.015
pH, UA: 6

## 2012-04-05 MED ORDER — METOCLOPRAMIDE HCL 10 MG PO TABS: 10.0000 mg | ORAL_TABLET | Freq: Four times a day (QID) | ORAL | Status: DC

## 2012-04-05 MED ORDER — COLCHICINE 0.6 MG PO TABS: ORAL_TABLET | ORAL | Status: DC

## 2012-04-05 MED ORDER — METAXALONE 800 MG PO TABS: ORAL_TABLET | ORAL | Status: DC

## 2012-04-05 NOTE — Progress Notes (Signed)
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 He's had intense pains in his right flank since Tuesday. No nausea or vomiting. Appetite is okay. His bowels move in frequently when he is working. After he has helped the stool 4 work days, he can have several bowel movements on a day off at home. He has had a cholecystectomy. He is also a kidney stones, the last was been removed by lithotripsy and told that nothing was in there at that time. He has not had hematuria. He has been trying to eat right, get high fiber in his diet, and push fluids. He is taken lactulose and MiraLax a lot of wall and Senokot T., and despite this is not moving well. No fever.  Objective: Somewhat overweight man who appears normal on exam. However when he tries to move such as laying down he gets severe spasm if pains in the right side of his back. He is actually unable to laydown, was unable to laydown last night. He is very tender in the right CVA area. Abdomen is soft and only may be some mild right up quadrant tenderness. Bowel sounds are present.  Assessment: Right flank abdominal pain History of constipation  Plan: Get labs and x-ray of the abdomen and proceed accordingly  [facility] reading (PRIMARY) by  Dr. Nonspecific stool gas.  Old surgical clips.    Results for orders placed in visit on 04/05/12  POCT CBC      Component Value Range   WBC 8.9  4.6 - 10.2 K/uL   Lymph, poc 1.6  0.6 - 3.4   POC LYMPH PERCENT 18.3  10 - 50 %L   MID (cbc) 0.5  0 - 0.9   POC MID % 5.6  0 - 12 %M   POC Granulocyte 6.8  2 - 6.9   Granulocyte percent 76.1  37 - 80 %G   RBC 5.74  4.69 - 6.13 M/uL   Hemoglobin 16.4  14.1 - 18.1 g/dL   HCT, POC 47.8  29.5 - 53.7 %   MCV 92.2  80 - 97 fL   MCH, POC 28.6  27 - 31.2 pg   MCHC 31.0 (*) 31.8 - 35.4 g/dL   RDW, POC 62.1     Platelet Count, POC 306  142 - 424 K/uL   MPV 9.0  0 - 99.8 fL  POCT UA - MICROSCOPIC ONLY      Component Value Range   WBC, Ur, HPF,  POC 0-1     RBC, urine, microscopic 0-2     Bacteria, U Microscopic trace     Mucus, UA neg     Epithelial cells, urine per micros neg     Crystals, Ur, HPF, POC neg     Casts, Ur, LPF, POC neg     Yeast, UA neg    POCT URINALYSIS DIPSTICK      Component Value Range   Color, UA yellow     Clarity, UA clear     Glucose, UA neg     Bilirubin, UA neg     Ketones, UA eng     Spec Grav, UA 1.015     Blood, UA trace     pH, UA 6.0     Protein, UA neg     Urobilinogen, UA 0.2     Nitrite, UA neg     Leukocytes, UA Negative     Gave him directions on how to use the laxatives. We'll try Skelaxin for  muscle relaxant. We'll also try and stimulate his gut briefly with some Reglan. Will not continue that long-term.

## 2012-04-05 NOTE — Patient Instructions (Addendum)
Drink plenty of fluids.  Continue the use of MiraLax and Senokot tea.  Use a fleets enema  Try Reglan up to 10 mg 4 times daily to stimulate bowel activity  If no relief, take the colchicine and see if that will help stimulate you.  If acute abdominal pain return. If flank pain is not improving return.  Skelaxin for muscle accident.

## 2012-04-05 NOTE — Telephone Encounter (Signed)
Left message on machine that our manager will look more into his brothers lipo test but i will send him the rest of his and his brothers results that was in [facility] name is Jonathan Ballard

## 2012-04-06 LAB — COMPREHENSIVE METABOLIC PANEL
AST: 21 U/L (ref 0–37)
Albumin: 4.5 g/dL (ref 3.5–5.2)
Alkaline Phosphatase: 68 U/L (ref 39–117)
BUN: 16 mg/dL (ref 6–23)
Creat: 1 mg/dL (ref 0.50–1.35)
Glucose, Bld: 58 mg/dL — ABNORMAL LOW (ref 70–99)
Total Bilirubin: 0.5 mg/dL (ref 0.3–1.2)

## 2012-04-08 ENCOUNTER — Encounter: Payer: Self-pay | Admitting: *Deleted

## 2012-04-29 ENCOUNTER — Ambulatory Visit: Payer: Managed Care, Other (non HMO)

## 2012-04-29 ENCOUNTER — Other Ambulatory Visit: Payer: Self-pay | Admitting: Endocrinology

## 2012-04-29 DIAGNOSIS — E291 Testicular hypofunction: Secondary | ICD-10-CM

## 2012-04-29 DIAGNOSIS — E785 Hyperlipidemia, unspecified: Secondary | ICD-10-CM

## 2012-04-29 LAB — LIPID PANEL
LDL Cholesterol: 55 mg/dL (ref 0–99)
Total CHOL/HDL Ratio: 4
VLDL: 19.2 mg/dL (ref 0.0–40.0)

## 2012-04-29 LAB — TESTOSTERONE: Testosterone: 513.43 ng/dL (ref 350.00–890.00)

## 2012-04-29 LAB — CORTISOL: Cortisol, Plasma: 19.4 ug/dL

## 2012-04-30 ENCOUNTER — Other Ambulatory Visit: Payer: Self-pay | Admitting: Endocrinology

## 2012-04-30 ENCOUNTER — Encounter: Payer: Self-pay | Admitting: Endocrinology

## 2012-04-30 DIAGNOSIS — E291 Testicular hypofunction: Secondary | ICD-10-CM

## 2012-05-02 ENCOUNTER — Ambulatory Visit: Payer: Managed Care, Other (non HMO) | Admitting: Endocrinology

## 2012-05-07 ENCOUNTER — Ambulatory Visit (INDEPENDENT_AMBULATORY_CARE_PROVIDER_SITE_OTHER): Payer: Managed Care, Other (non HMO) | Admitting: Endocrinology

## 2012-05-07 ENCOUNTER — Encounter: Payer: Self-pay | Admitting: Endocrinology

## 2012-05-07 VITALS — BP 134/70 | HR 104 | Temp 97.8°F | Wt 158.0 lb

## 2012-05-07 DIAGNOSIS — E291 Testicular hypofunction: Secondary | ICD-10-CM

## 2012-05-07 MED ORDER — CLOMIPHENE CITRATE 50 MG PO TABS: ORAL_TABLET | ORAL | Status: DC

## 2012-05-07 NOTE — Patient Instructions (Addendum)
Please continue the same medication Please come back for a follow-up appointment in 1 year 

## 2012-05-07 NOTE — Progress Notes (Signed)
Subjective:    Patient ID: Jonathan Ballard, male    DOB: May 22, 1970, 42 y.o.   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  He says the clomid has helped, but he really can't really interpret sxs for sure, because of a recent marital separation.   Past Medical History  Diagnosis Date  . Hypercholesteremia   . Kidney stone   . Sleep apnea     uses C-PAP  . Low testosterone   . Psoriasis     Past Surgical History  Procedure Date  . Lithotripsy   . Cholecystectomy   . Ureterolithotomy 2008    History   Social History  . Marital Status: Married    Spouse Name: N/A    Number of Children: N/A  . Years of Education: N/A   Occupational History  . Not on file.   Social History Main Topics  . Smoking status: Never Smoker   . Smokeless tobacco: Not on file  . Alcohol Use: No  . Drug Use: No  . Sexually Active: Yes   Other Topics Concern  . Not on file   Social History Narrative  . No narrative on file    Current Outpatient Prescriptions on File Prior to Visit  Medication Sig Dispense Refill  . aspirin 81 MG tablet Take 81 mg by mouth every morning.       . calcipotriene-betamethasone (TACLONEX) ointment Apply topically daily.      . Cholecalciferol (VITAMIN D3) 1000 UNITS CAPS Take 5,000 Units by mouth every morning.       . clobetasol (TEMOVATE) 0.05 % external solution Apply 1 application topically 2 (two) times a week.       . Clobetasol Propionate (CLOBEX) 0.05 % shampoo Apply topically 2 (two) times a week.       . colchicine (COLCRYS) 0.6 MG tablet Take one twice daily as directed  10 tablet  1  . docusate sodium (COLACE) 100 MG capsule Take 100 mg by mouth as needed.       . fluocinonide (LIDEX) 0.05 % external solution Apply topically as needed.       . hydrocortisone valerate cream (WESTCORT) 0.2 % Apply topically 2 (two) times daily.      Marland Kitchen ketorolac (TORADOL) 10 MG tablet Take 10 mg by mouth every 6 (six) hours as needed.       . lactulose, encephalopathy, (CHRONULAC) 10 GM/15ML SOLN Take by mouth as needed.      . metaxalone (SKELAXIN) 800 MG tablet 1/2 to 1 three times daily for constipation.  30 tablet  1  . metoCLOPramide (REGLAN) 10 MG tablet Take 1 tablet (10 mg total) by mouth 4 (four) times daily.  28 tablet  0  . omega-3 acid ethyl esters (LOVAZA) 1 G capsule Take 1 capsule (1 g total) by mouth 3 (three) times daily.  90 capsule  11  . Probiotic Product (PROBIOTIC DAILY PO) Take by mouth.      . rosuvastatin (CRESTOR) 20 MG tablet Take 1 tablet (20 mg total) by mouth every morning.  30 tablet  11  . Salicylic Acid (SALEX) 6 % SHAM Apply topically.      . triamcinolone (KENALOG) topical spray Apply topically 2 (two) times daily.      Marland Kitchen zolpidem (AMBIEN) 10 MG tablet TAKE 1/2  TABLET BY MOUTH AT BEDTIME AS NEEDED  15 tablet  0    Allergies  Allergen Reactions  . Zithromax (Azithromycin Dihydrate) Other (  See Comments)    Reaction=tongue swells  . Niacin And Related Rash  . Niaspan (Niacin Er) Rash  . Penicillins Rash    Family History  Problem Relation Age of Onset  . Diabetes Father   . Bladder Cancer Father   . Hypothyroidism Father   . Hyperlipidemia Father   . Cancer Other   . Hyperlipidemia Other   . Breast cancer Mother   . Hypothyroidism Mother   . Cancer Mother   . ADD / ADHD Son   . Anxiety disorder Son     BP 134/70  Pulse 104  Temp 97.8 F (36.6 C) (Oral)  Wt 158 lb (71.668 kg)  SpO2 97%    Review of Systems He has lost weight.    Objective:   Physical Exam VITAL SIGNS:  See vs page GENERAL: no distress PSYCH: Alert and oriented x 3.  Does not appear anxious nor depressed.  Lab Results  Component Value Date   TESTOSTERONE 513.43 04/29/2012  prolactin was normal in late 2013    Assessment & Plan:  Hypogonadism, well-controlled

## 2012-06-05 ENCOUNTER — Encounter: Payer: Self-pay | Admitting: Family Medicine

## 2012-09-24 ENCOUNTER — Telehealth: Payer: Self-pay

## 2012-09-24 ENCOUNTER — Ambulatory Visit (INDEPENDENT_AMBULATORY_CARE_PROVIDER_SITE_OTHER): Payer: Managed Care, Other (non HMO) | Admitting: Emergency Medicine

## 2012-09-24 ENCOUNTER — Other Ambulatory Visit: Payer: Self-pay | Admitting: Emergency Medicine

## 2012-09-24 ENCOUNTER — Encounter: Payer: Self-pay | Admitting: Emergency Medicine

## 2012-09-24 ENCOUNTER — Ambulatory Visit: Payer: Managed Care, Other (non HMO)

## 2012-09-24 VITALS — BP 130/78 | HR 69 | Temp 98.1°F | Resp 16 | Ht 64.0 in | Wt 154.0 lb

## 2012-09-24 DIAGNOSIS — N200 Calculus of kidney: Secondary | ICD-10-CM

## 2012-09-24 DIAGNOSIS — E785 Hyperlipidemia, unspecified: Secondary | ICD-10-CM

## 2012-09-24 DIAGNOSIS — Z0000 Encounter for general adult medical examination without abnormal findings: Secondary | ICD-10-CM

## 2012-09-24 DIAGNOSIS — G4700 Insomnia, unspecified: Secondary | ICD-10-CM

## 2012-09-24 LAB — COMPREHENSIVE METABOLIC PANEL
ALT: 25 U/L (ref 0–53)
Albumin: 3.9 g/dL (ref 3.5–5.2)
CO2: 28 mEq/L (ref 19–32)
Calcium: 9.3 mg/dL (ref 8.4–10.5)
Chloride: 103 mEq/L (ref 96–112)
Potassium: 4.7 mEq/L (ref 3.5–5.3)
Sodium: 139 mEq/L (ref 135–145)
Total Bilirubin: 0.7 mg/dL (ref 0.3–1.2)
Total Protein: 6.6 g/dL (ref 6.0–8.3)

## 2012-09-24 LAB — POCT UA - MICROSCOPIC ONLY
Mucus, UA: NEGATIVE
WBC, Ur, HPF, POC: NEGATIVE
Yeast, UA: NEGATIVE

## 2012-09-24 LAB — POCT URINALYSIS DIPSTICK
Ketones, UA: NEGATIVE
Leukocytes, UA: NEGATIVE
Protein, UA: NEGATIVE

## 2012-09-24 LAB — CBC WITH DIFFERENTIAL/PLATELET
Hemoglobin: 17.1 g/dL — ABNORMAL HIGH (ref 13.0–17.0)
Lymphocytes Relative: 20 % (ref 12–46)
Lymphs Abs: 1.7 10*3/uL (ref 0.7–4.0)
Neutro Abs: 6 10*3/uL (ref 1.7–7.7)
Neutrophils Relative %: 73 % (ref 43–77)
Platelets: 245 10*3/uL (ref 150–400)
RBC: 5.49 MIL/uL (ref 4.22–5.81)
WBC: 8.3 10*3/uL (ref 4.0–10.5)

## 2012-09-24 LAB — TSH: TSH: 3.755 u[IU]/mL (ref 0.350–4.500)

## 2012-09-24 LAB — IFOBT (OCCULT BLOOD): IFOBT: NEGATIVE

## 2012-09-24 LAB — LIPID PANEL: Total CHOL/HDL Ratio: 5.3 Ratio

## 2012-09-24 MED ORDER — KETOROLAC TROMETHAMINE 10 MG PO TABS: 10.0000 mg | ORAL_TABLET | Freq: Four times a day (QID) | ORAL | Status: DC | PRN

## 2012-09-24 MED ORDER — ZOLPIDEM TARTRATE 10 MG PO TABS: ORAL_TABLET | ORAL | Status: DC

## 2012-09-24 MED ORDER — OMEGA-3-ACID ETHYL ESTERS 1 G PO CAPS: 1.0000 g | ORAL_CAPSULE | Freq: Three times a day (TID) | ORAL | Status: DC

## 2012-09-24 MED ORDER — ROSUVASTATIN CALCIUM 20 MG PO TABS
20.0000 mg | ORAL_TABLET | ORAL | Status: DC
Start: 2012-09-24 — End: 2014-01-14

## 2012-09-24 NOTE — Telephone Encounter (Signed)
Pharm reqs RFs for ketorolac 10 mg tab, 1 tab TID prn pain. Dr Cleta Alberts, you just saw pt this morning but don't believe you have Rxd this for pt before. Prev Rx written by Marcine Matar. Do you want to Rx for pt?

## 2012-09-24 NOTE — Progress Notes (Signed)
@[email or web address] @  Patient ID: Jonathan Ballard MRN: [id number], DOB: 02-Nov-1970 42 y.o. Date of Encounter: 09/24/2012, 11:07 AM  Primary Physician: Lucilla Edin, MD  Chief Complaint: Physical (CPE)  HPI: 42 y.o. y/o male with history noted below here for CPE.  Doing well. No issues/complaints.  Review of Systems: Consitutional: No fever, chills, fatigue, night sweats, lymphadenopathy, or weight changes he is been very depressed recently. He has been receiving counseling through his rabbi. His wife separated from him in January. He is seeking legal advice regarding the custody battle over the children and his upcoming divorce. He is considering moving to a different city.. Eyes: No visual changes, eye redness, or discharge. ENT/Mouth: Ears: No otalgia, tinnitus, hearing loss, discharge. Nose: No congestion, rhinorrhea, sinus pain, or epistaxis. Throat: No sore throat, post nasal drip, or teeth pain. Cardiovascular: No CP, palpitations, diaphoresis, DOE, edema, orthopnea, PND. He has significant problem hyperlipidemia. This is a familial problem Respiratory: No cough, hemoptysis, SOB, or wheezing. Gastrointestinal: No anorexia, dysphagia, reflux, pain, nausea, vomiting, hematemesis, diarrhea, constipation, BRBPR, or melena. Genitourinary: He has a history of kidney stones. Yesterday he thinks he may have passed a stone with left lower abdominal pain. Musculoskeletal: No decreased ROM, myalgias, stiffness, joint swelling, or weakness. Skin: No rash, erythema, lesion changes, pain, warmth, jaundice, or pruritis. Neurological: No headache, dizziness, syncope, seizures, tremors, memory loss, coordination problems, or paresthesias. Psychological: No anxiety, depression, hallucinations, SI/HI. Endocrine: No fatigue, polydipsia, polyphagia, polyuria, or known diabetes. All other systems were reviewed and are otherwise negative.  Past Medical History  Diagnosis Date  . Hypercholesteremia   . Kidney  stone   . Sleep apnea     uses C-PAP  . Low testosterone   . Psoriasis      Past Surgical History  Procedure Laterality Date  . Lithotripsy    . Cholecystectomy    . Ureterolithotomy  2008    Home Meds:  Prior to Admission medications   Medication Sig Start Date End Date Taking? Authorizing Provider  aspirin 81 MG tablet Take 81 mg by mouth every morning.    Yes Historical Provider, MD  calcipotriene-betamethasone (TACLONEX) ointment Apply topically daily.   Yes Historical Provider, MD  Cholecalciferol (VITAMIN D3) 1000 UNITS CAPS Take 5,000 Units by mouth every morning.    Yes Historical Provider, MD  clobetasol (TEMOVATE) 0.05 % external solution Apply 1 application topically 2 (two) times a week.    Yes Historical Provider, MD  Clobetasol Propionate (CLOBEX) 0.05 % shampoo Apply topically 2 (two) times a week.    Yes Historical Provider, MD  clomiPHENE (CLOMID) 50 MG tablet 1/2 tab daily 05/07/12  Yes Romero Belling, MD  colchicine (COLCRYS) 0.6 MG tablet Take one twice daily as directed 04/05/12  Yes Peyton Najjar, MD  docusate sodium (COLACE) 100 MG capsule Take 100 mg by mouth as needed.    Yes Historical Provider, MD  fluocinonide (LIDEX) 0.05 % external solution Apply topically as needed.    Yes Historical Provider, MD  hydrocortisone valerate cream (WESTCORT) 0.2 % Apply topically 2 (two) times daily.   Yes Historical Provider, MD  ketorolac (TORADOL) 10 MG tablet Take 10 mg by mouth every 6 (six) hours as needed.   Yes Historical Provider, MD  lactulose, encephalopathy, (CHRONULAC) 10 GM/15ML SOLN Take by mouth as needed.   Yes Historical Provider, MD  metaxalone (SKELAXIN) 800 MG tablet 1/2 to 1 three times daily for constipation. 04/05/12  Yes Peyton Najjar, MD  modafinil (PROVIGIL)  100 MG tablet Take 100 mg by mouth daily.   Yes Historical Provider, MD  omega-3 acid ethyl esters (LOVAZA) 1 G capsule Take 1 capsule (1 g total) by mouth 3 (three) times daily. 09/12/11  Yes  Collene Gobble, MD  Probiotic Product (PROBIOTIC DAILY PO) Take by mouth.   Yes Historical Provider, MD  rosuvastatin (CRESTOR) 20 MG tablet Take 1 tablet (20 mg total) by mouth every morning. 09/12/11  Yes Collene Gobble, MD  Salicylic Acid (SALEX) 6 % SHAM Apply topically.   Yes Historical Provider, MD  triamcinolone (KENALOG) topical spray Apply topically 2 (two) times daily.   Yes Historical Provider, MD  zolpidem (AMBIEN) 10 MG tablet TAKE 1/2  TABLET BY MOUTH AT BEDTIME AS NEEDED 07/24/11  Yes Morrell Riddle, PA-C  metoCLOPramide (REGLAN) 10 MG tablet Take 1 tablet (10 mg total) by mouth 4 (four) times daily. 04/05/12   Peyton Najjar, MD    Allergies:  Allergies  Allergen Reactions  . Zithromax (Azithromycin Dihydrate) Other (See Comments)    Reaction=tongue swells  . Niacin And Related Rash  . Niaspan (Niacin Er) Rash  . Penicillins Rash    History   Social History  . Marital Status: Married    Spouse Name: N/A    Number of Children: N/A  . Years of Education: N/A   Occupational History  . Not on file.   Social History Main Topics  . Smoking status: Never Smoker   . Smokeless tobacco: Not on file  . Alcohol Use: No  . Drug Use: No  . Sexually Active: Yes   Other Topics Concern  . Not on file   Social History Narrative  . No narrative on file    Family History  Problem Relation Age of Onset  . Diabetes Father   . Bladder Cancer Father   . Hypothyroidism Father   . Hyperlipidemia Father   . Cancer Other   . Hyperlipidemia Other   . Breast cancer Mother   . Hypothyroidism Mother   . Cancer Mother   . ADD / ADHD Son   . Anxiety disorder Son   . Alzheimer's disease Maternal Grandmother   . Cancer Maternal Grandfather   . Stroke Paternal Grandmother   . Diabetes Paternal Grandmother   . Cancer Paternal Grandmother   . Cancer Paternal Grandfather     Physical Exam:  Blood pressure 130/78, pulse 69, temperature 98.1 F (36.7 C), temperature source Oral,  resp. rate 16, height 5\' 4"  (1.626 m), weight 154 lb (69.854 kg), SpO2 100.00%.  General: Well developed, well nourished, in no acute distress. HEENT: Normocephalic, atraumatic. Conjunctiva pink, sclera non-icteric. Pupils 2 mm constricting to 1 mm, round, regular, and equally reactive to light and accomodation. EOMI. Internal auditory canal clear. TMs with good cone of light and without pathology. Nasal mucosa pink. Nares are without discharge. No sinus tenderness. Oral mucosa pink. Dentition . Pharynx without exudate.   Neck: Supple. Trachea midline. No thyromegaly. Full ROM. No lymphadenopathy. Lungs: Clear to auscultation bilaterally without wheezes, rales, or rhonchi. Breathing is of normal effort and unlabored. Cardiovascular: RRR with S1 S2. No murmurs, rubs, or gallops appreciated. Distal pulses 2+ symmetrically. No carotid or abdominal bruits.  Abdomen: Soft, non-tender, non-distended with normoactive bowel sounds. No hepatosplenomegaly or masses. No rebound/guarding. No CVA tenderness. Without hernias.  Rectal: No external hemorrhoids or fissures. Rectal vault without masses.   Genitourinary:   circumcised male. No penile lesions. Testes descended bilaterally, and smooth  without tenderness or masses.  Musculoskeletal: Full range of motion and 5/5 strength throughout. Without swelling, atrophy, tenderness, crepitus, or warmth. Extremities without clubbing, cyanosis, or edema. Calves supple. Skin: Warm and moist without erythema, ecchymosis, wounds,  He has psoriatic skin lesions.  Neuro: A+Ox3. CN II-XII grossly intact. Moves all extremities spontaneously. Full sensation throughout. Normal gait. DTR 2+ throughout upper and lower extremities. Finger to nose intact. Psych:  Responds to questions appropriately with a normal affect.   Results for orders placed in visit on 09/24/12  POCT URINALYSIS DIPSTICK      Result Value Range   Color, UA yellow     Clarity, UA clear     Glucose, UA  neg     Bilirubin, UA neg     Ketones, UA neg     Spec Grav, UA 1.010     Blood, UA trace     pH, UA 7.0     Protein, UA neg     Urobilinogen, UA 0.2     Nitrite, UA neg     Leukocytes, UA Negative    POCT UA - MICROSCOPIC ONLY      Result Value Range   WBC, Ur, HPF, POC neg     RBC, urine, microscopic 0-2     Bacteria, U Microscopic neg     Mucus, UA neg     Epithelial cells, urine per micros 0-1     Crystals, Ur, HPF, POC neg     Casts, Ur, LPF, POC neg     Yeast, UA neg    IFOBT (OCCULT BLOOD)      Result Value Range   IFOBT Negative     Studies: CBC, CMET, Lipid,  TSH, [facility] reading (PRIMARY) by  Dr.Ife Vitelli no stones seen. Status post cholecystectomy    Assessment/Plan:  42 y.o. y/o here for physical exam. He has been under a lot of stress recently regarding recent separation from his wife and life decisions. He's currently seeing a psychiatrist or psychologist in discussing his situation with his rabbi. He denies being suicidal .  -  Signed, Earl Lites, MD 09/24/2012 11:07 AM

## 2012-09-24 NOTE — Progress Notes (Signed)
  Subjective:    Patient ID: Jonathan Ballard, male    DOB: 07/27/1970, 41 y.o.   MRN: [id number]  HPI     Review of Systems     Objective:   Physical Exam        Assessment & Plan:   

## 2012-09-24 NOTE — Progress Notes (Deleted)
  Subjective:    Patient ID: Jonathan Ballard, male    DOB: 1971/01/29, 42 y.o.   MRN: [id number]  HPI     Review of Systems     Objective:   Physical Exam        Assessment & Plan:

## 2012-09-24 NOTE — Telephone Encounter (Signed)
I have taken care of this prescription it has been called in

## 2012-09-29 ENCOUNTER — Telehealth: Payer: Self-pay

## 2012-09-29 ENCOUNTER — Other Ambulatory Visit: Payer: Self-pay | Admitting: Emergency Medicine

## 2012-09-29 NOTE — Progress Notes (Signed)
Pt aware of results 

## 2012-09-29 NOTE — Telephone Encounter (Signed)
Patient is returning our phone call regarding his lab results.   Best#: [phone]

## 2012-10-03 ENCOUNTER — Other Ambulatory Visit: Payer: Self-pay | Admitting: Radiology

## 2012-10-03 ENCOUNTER — Telehealth: Payer: Self-pay

## 2012-10-03 DIAGNOSIS — G4700 Insomnia, unspecified: Secondary | ICD-10-CM

## 2012-10-03 MED ORDER — ZOLPIDEM TARTRATE 10 MG PO TABS: ORAL_TABLET | ORAL | Status: DC

## 2012-10-03 NOTE — Telephone Encounter (Signed)
Jonathan Ballard on [facility]. Request refill on Zolpidem 10 mg

## 2012-10-03 NOTE — Telephone Encounter (Signed)
refill his Ambien. He can have #30 with 5 refills.

## 2012-10-04 NOTE — Telephone Encounter (Signed)
This is done but Dr Cleta Alberts indicated #15 not #30 sent in.

## 2012-10-08 ENCOUNTER — Other Ambulatory Visit: Payer: Self-pay | Admitting: Emergency Medicine

## 2012-10-08 DIAGNOSIS — N200 Calculus of kidney: Secondary | ICD-10-CM

## 2012-10-08 MED ORDER — KETOROLAC TROMETHAMINE 10 MG PO TABS: 10.0000 mg | ORAL_TABLET | Freq: Four times a day (QID) | ORAL | Status: DC | PRN

## 2012-11-04 ENCOUNTER — Other Ambulatory Visit: Payer: Self-pay | Admitting: Emergency Medicine

## 2012-11-06 NOTE — Telephone Encounter (Signed)
Dr Cleta Alberts, do you want to RF this? Not sure from your OV notes if constipation was discussed at PE.

## 2012-12-22 ENCOUNTER — Encounter: Payer: Self-pay | Admitting: Emergency Medicine

## 2012-12-25 ENCOUNTER — Telehealth: Payer: Self-pay | Admitting: *Deleted

## 2012-12-25 NOTE — Telephone Encounter (Signed)
Called patient and left message at Dr. Ellis Parents request, to let him know about bringing his brother in for lab work Jonathan Ballard). Let patient know to bring his brother in on September 15 or 16 so I can make sure the visit and tests that the Dr.   wants gets completed properly. Dr. Has given me orders and a Liposcience instead of regular lipid.

## 2013-01-07 ENCOUNTER — Telehealth: Payer: Self-pay | Admitting: Family Medicine

## 2013-01-07 NOTE — Telephone Encounter (Signed)
LMOM to see if he gotten message Dr Cleta Alberts sent him in [facility] CHART

## 2013-05-15 ENCOUNTER — Encounter: Payer: Self-pay | Admitting: Endocrinology

## 2013-05-16 ENCOUNTER — Other Ambulatory Visit: Payer: Self-pay | Admitting: Endocrinology

## 2013-05-16 DIAGNOSIS — E291 Testicular hypofunction: Secondary | ICD-10-CM

## 2013-05-22 ENCOUNTER — Emergency Department (HOSPITAL_COMMUNITY)
Admission: EM | Admit: 2013-05-22 | Discharge: 2013-05-22 | Disposition: A | Discharge status: Home / Self Care | Payer: Managed Care, Other (non HMO) | Attending: Emergency Medicine | Admitting: Emergency Medicine

## 2013-05-22 ENCOUNTER — Encounter (HOSPITAL_COMMUNITY): Payer: Self-pay | Admitting: Emergency Medicine

## 2013-05-22 ENCOUNTER — Emergency Department (HOSPITAL_COMMUNITY): Payer: Managed Care, Other (non HMO)

## 2013-05-22 DIAGNOSIS — Z87442 Personal history of urinary calculi: Secondary | ICD-10-CM | POA: Insufficient documentation

## 2013-05-22 DIAGNOSIS — R141 Gas pain: Secondary | ICD-10-CM | POA: Insufficient documentation

## 2013-05-22 DIAGNOSIS — Z9089 Acquired absence of other organs: Secondary | ICD-10-CM | POA: Insufficient documentation

## 2013-05-22 DIAGNOSIS — R61 Generalized hyperhidrosis: Secondary | ICD-10-CM | POA: Insufficient documentation

## 2013-05-22 DIAGNOSIS — R142 Eructation: Secondary | ICD-10-CM | POA: Insufficient documentation

## 2013-05-22 DIAGNOSIS — R110 Nausea: Secondary | ICD-10-CM | POA: Insufficient documentation

## 2013-05-22 DIAGNOSIS — G4730 Sleep apnea, unspecified: Secondary | ICD-10-CM | POA: Insufficient documentation

## 2013-05-22 DIAGNOSIS — IMO0002 Reserved for concepts with insufficient information to code with codable children: Secondary | ICD-10-CM | POA: Insufficient documentation

## 2013-05-22 DIAGNOSIS — R143 Flatulence: Secondary | ICD-10-CM

## 2013-05-22 DIAGNOSIS — Z7982 Long term (current) use of aspirin: Secondary | ICD-10-CM | POA: Insufficient documentation

## 2013-05-22 DIAGNOSIS — Z872 Personal history of diseases of the skin and subcutaneous tissue: Secondary | ICD-10-CM | POA: Insufficient documentation

## 2013-05-22 DIAGNOSIS — Z8659 Personal history of other mental and behavioral disorders: Secondary | ICD-10-CM | POA: Insufficient documentation

## 2013-05-22 DIAGNOSIS — Z79899 Other long term (current) drug therapy: Secondary | ICD-10-CM | POA: Insufficient documentation

## 2013-05-22 DIAGNOSIS — K5900 Constipation, unspecified: Secondary | ICD-10-CM | POA: Insufficient documentation

## 2013-05-22 DIAGNOSIS — E7800 Pure hypercholesterolemia, unspecified: Secondary | ICD-10-CM | POA: Insufficient documentation

## 2013-05-22 LAB — COMPREHENSIVE METABOLIC PANEL
ALT: 19 U/L (ref 0–53)
AST: 17 U/L (ref 0–37)
Albumin: 3.7 g/dL (ref 3.5–5.2)
Alkaline Phosphatase: 70 U/L (ref 39–117)
BILIRUBIN TOTAL: 0.2 mg/dL — AB (ref 0.3–1.2)
BUN: 15 mg/dL (ref 6–23)
CO2: 26 meq/L (ref 19–32)
Calcium: 9.1 mg/dL (ref 8.4–10.5)
Chloride: 100 mEq/L (ref 96–112)
Creatinine, Ser: 0.86 mg/dL (ref 0.50–1.35)
GFR calc Af Amer: >90 mL/min (ref >90)
Glucose, Bld: 108 mg/dL — ABNORMAL HIGH (ref 70–99)
Potassium: 4.3 mEq/L (ref 3.7–5.3)
SODIUM: 137 meq/L (ref 137–147)
Total Protein: 7.4 g/dL (ref 6.0–8.3)

## 2013-05-22 LAB — CBC WITH DIFFERENTIAL/PLATELET
BASOS ABS: 0 10*3/uL (ref 0.0–0.1)
Basophils Relative: 0 % (ref 0–1)
Eosinophils Absolute: 0.2 10*3/uL (ref 0.0–0.7)
Eosinophils Relative: 1 % (ref 0–5)
HCT: 46.7 % (ref 39.0–52.0)
Hemoglobin: 15.6 g/dL (ref 13.0–17.0)
LYMPHS PCT: 10 % — AB (ref 12–46)
Lymphs Abs: 1.1 10*3/uL (ref 0.7–4.0)
MCH: 30.6 pg (ref 26.0–34.0)
MCHC: 33.4 g/dL (ref 30.0–36.0)
MCV: 91.6 fL (ref 78.0–100.0)
MONO ABS: 0.9 10*3/uL (ref 0.1–1.0)
Monocytes Relative: 8 % (ref 3–12)
Neutro Abs: 9.2 10*3/uL — ABNORMAL HIGH (ref 1.7–7.7)
Neutrophils Relative %: 81 % — ABNORMAL HIGH (ref 43–77)
PLATELETS: 245 10*3/uL (ref 150–400)
RBC: 5.1 MIL/uL (ref 4.22–5.81)
RDW: 13.1 % (ref 11.5–15.5)
WBC: 11.4 10*3/uL — AB (ref 4.0–10.5)

## 2013-05-22 LAB — LIPASE, BLOOD: Lipase: 40 U/L (ref 11–59)

## 2013-05-22 MED ORDER — SODIUM CHLORIDE 0.9 % IV BOLUS (SEPSIS)
1000.0000 mL | Freq: Once | INTRAVENOUS | Status: AC
  Administered 2013-05-22: 1000 mL via INTRAVENOUS

## 2013-05-22 MED ORDER — KETOROLAC TROMETHAMINE 30 MG/ML IJ SOLN
30.0000 mg | Freq: Once | INTRAMUSCULAR | Status: AC
  Administered 2013-05-22: 30 mg via INTRAVENOUS
  Filled 2013-05-22: qty 1

## 2013-05-22 MED ORDER — METOCLOPRAMIDE HCL 5 MG/ML IJ SOLN
10.0000 mg | Freq: Once | INTRAMUSCULAR | Status: AC
  Administered 2013-05-22: 10 mg via INTRAVENOUS
  Filled 2013-05-22: qty 2

## 2013-05-22 MED ORDER — POLYETHYLENE GLYCOL 3350 17 GM/SCOOP PO POWD: 17.0000 g | Freq: Every day | ORAL | Status: DC

## 2013-05-22 NOTE — Discharge Instructions (Signed)
Your lab testing and x-ray did not show signs of a concerning or emergent condition at this time. Your providers feel that your symptoms are still caused by constipation. Please continue MiraLax as a stool softener with good water intake and light exercise and walking. Have a recheck of your symptoms in 2 days if you are still not improved. Return sooner if you have any worsening or changing symptoms.   Constipation, Adult Constipation is when a person:  Poops (bowel movement) less than 3 times a week.  Has a hard time pooping.  Has poop that is dry, hard, or bigger than normal. HOME CARE   Eat more fiber, such as fruits, vegetables, whole grains like brown rice, and beans.  Eat less fatty foods and sugar. This includes JamaicaFrench fries, hamburgers, cookies, candy, and soda.  If you are not getting enough fiber from food, take products with added fiber in them (supplements).  Drink enough fluid to keep your pee (urine) clear or pale yellow.  Go to the restroom when you feel like you need to poop. Do not hold it.  Only take medicine as told by your doctor. Do not take medicines that help you poop (laxatives) without talking to your doctor first.  Exercise on a regular basis, or as told by your doctor. GET HELP RIGHT AWAY IF:   You have bright red blood in your poop (stool).  Your constipation lasts more than 4 days or gets worse.  You have belly (abdomen) or butt (rectal) pain.  You have thin poop (as thin as a pencil).  You lose weight, and it cannot be explained. MAKE SURE YOU:   Understand these instructions.  Will watch your condition.  Will get help right away if you are not doing well or get worse. Document Released: 09/20/2007 Document Revised: 06/26/2011 Document Reviewed: 01/13/2013 [facility] Patient Information 2014 [place], [place].

## 2013-05-22 NOTE — ED Provider Notes (Signed)
CSN: [id number]     Arrival date & time 05/22/13  0156 History   First MD Initiated Contact with Patient 05/22/13 0245     Chief Complaint  Patient presents with  . Constipation   HPI  History provided by the patient. Patient is a 43 year old male with history of cholecystectomy, hypercholesterolemia, depression, psoriasis and kidney stones presents with complaints of worsened abdominal pain and constipation. Patient reports having worsening constipation symptoms for the past 10 days. Patient will normally have a bowel movement every other day or every 2 days. He began having worsening constipation with hard stools and less frequent bowel movements. He began using increased fiber, Colace, MiraLax and lactulose. Patient felt like he was having some improvements with large bowel movement on Tuesday morning but then since has had some increased abdominal pains area and his pain is primarily in the right upper quadrant. Symptoms have been associated with some episodes of feeling hot and having chills and sweats. He denies any fever. Denies any nausea or vomiting symptoms. He has continued a normal appetite. Denies any rectal pain or rectal bleeding.      Past Medical History  Diagnosis Date  . Hypercholesteremia   . Kidney stone   . Sleep apnea     uses C-PAP  . Low testosterone   . Psoriasis    Past Surgical History  Procedure Laterality Date  . Lithotripsy    . Cholecystectomy    . Ureterolithotomy  2008   Family History  Problem Relation Age of Onset  . Diabetes Father   . Bladder Cancer Father   . Hypothyroidism Father   . Hyperlipidemia Father   . Cancer Other   . Hyperlipidemia Other   . Breast cancer Mother   . Hypothyroidism Mother   . Cancer Mother   . ADD / ADHD Son   . Anxiety disorder Son   . Alzheimer's disease Maternal Grandmother   . Cancer Maternal Grandfather   . Stroke Paternal Grandmother   . Diabetes Paternal Grandmother   . Cancer Paternal Grandmother    . Cancer Paternal Grandfather    History  Substance Use Topics  . Smoking status: Never Smoker   . Smokeless tobacco: Not on file  . Alcohol Use: No    Review of Systems  Constitutional: Positive for diaphoresis. Negative for fever.  Respiratory: Negative for cough.   Cardiovascular: Negative for chest pain.  Gastrointestinal: Positive for nausea, abdominal pain and constipation. Negative for vomiting, diarrhea and blood in stool.  Genitourinary: Negative for dysuria, frequency, hematuria and flank pain.  All other systems reviewed and are negative.    Allergies  Zithromax; Niacin and related; Niaspan; and Penicillins  Home Medications   Current Outpatient Rx  Name  Route  Sig  Dispense  Refill  . aspirin 81 MG tablet   Oral   Take 81 mg by mouth every morning.          . calcipotriene-betamethasone (TACLONEX) ointment   Topical   Apply topically daily.         . Cholecalciferol (VITAMIN D3) 1000 UNITS CAPS   Oral   Take 5,000 Units by mouth every morning.          . clobetasol (TEMOVATE) 0.05 % external solution   Topical   Apply 1 application topically 2 (two) times a week.          . Clobetasol Propionate (CLOBEX) 0.05 % shampoo   Topical   Apply topically 2 (two) times  a week.          . clomiPHENE (CLOMID) 50 MG tablet      1/2 tab daily   30 tablet   5   . colchicine (COLCRYS) 0.6 MG tablet      Take one twice daily as directed   10 tablet   1   . docusate sodium (COLACE) 100 MG capsule   Oral   Take 100 mg by mouth as needed.          . fluocinonide (LIDEX) 0.05 % external solution   Topical   Apply topically as needed.          . hydrocortisone valerate cream (WESTCORT) 0.2 %   Topical   Apply topically 2 (two) times daily.         Marland Kitchen ketorolac (TORADOL) 10 MG tablet   Oral   Take 1 tablet (10 mg total) by mouth every 6 (six) hours as needed.   15 tablet   0   . lactulose (CHRONULAC) 10 GM/15ML solution      TAKE  30 CCS BY MOUTH TWICE DAILY   480 mL   1     CYCLE FILL MEDICATION. Authorization is required f ...   . metaxalone (SKELAXIN) 800 MG tablet      1/2 to 1 three times daily for constipation.   30 tablet   1   . metoCLOPramide (REGLAN) 10 MG tablet   Oral   Take 1 tablet (10 mg total) by mouth 4 (four) times daily.   28 tablet   0   . modafinil (PROVIGIL) 100 MG tablet   Oral   Take 100 mg by mouth daily.         Marland Kitchen omega-3 acid ethyl esters (LOVAZA) 1 G capsule   Oral   Take 1 capsule (1 g total) by mouth 3 (three) times daily.   90 capsule   11     CYCLE FILL MEDICATION. Authorization is required f ...   . Probiotic Product (PROBIOTIC DAILY PO)   Oral   Take by mouth.         . rosuvastatin (CRESTOR) 20 MG tablet   Oral   Take 1 tablet (20 mg total) by mouth every morning.   30 tablet   11   . Salicylic Acid (SALEX) 6 % SHAM   Apply externally   Apply topically.         . triamcinolone (KENALOG) topical spray   Topical   Apply topically 2 (two) times daily.         Marland Kitchen zolpidem (AMBIEN) 10 MG tablet      TAKE 1/2 TABLET BY MOUTH AT BEDTIME AS NEEDED   15 tablet   5    BP 136/79  Pulse 78  Temp(Src) 98.2 F (36.8 C) (Oral)  Resp 16  SpO2 98% Physical Exam  Nursing note and vitals reviewed. Constitutional: He is oriented to person, place, and time. He appears well-developed and well-nourished. No distress.  HENT:  Head: Normocephalic.  Cardiovascular: Normal rate and regular rhythm.   Pulmonary/Chest: Effort normal and breath sounds normal. No respiratory distress. He has no wheezes.  Abdominal: Soft. He exhibits distension. Bowel sounds are increased. There is tenderness in the right upper quadrant. There is no rigidity, no rebound and no CVA tenderness.  There is increased tympany to percussion over the right and mid abdomen. Slight dullness to the left lower abdomen. Diffuse tenderness greatest in the right upper quadrant.  Neurological: He  is alert and oriented to person, place, and time.  Skin: Skin is warm.  Psychiatric: He has a normal mood and affect. His behavior is normal.    ED Course  Procedures  DIAGNOSTIC STUDIES: Oxygen Saturation is 98% on room air.  COORDINATION OF CARE:  Nursing notes reviewed. Vital signs reviewed. Initial pt interview and examination performed.   3:00 AM-patient seen and evaluated. He appears in discomfort but no acute distress. Reports having difficulties with constipation for the past 10 days. No nausea no vomiting. Discussed work up plan with pt at bedside, which includes labs and acute abdomen x-ray. Pt agrees with plan.  Patient reports having good improvement of symptoms after medications. Does report occasional crampy pains only 5/10 at this time. I discussed lab findings and reviewed the x-ray with patient in the room. At this time he is ready to return home. Have advised him to continue MiraLax as a stool softener and followup for recheck in 2 days. He agrees. Strict return precautions given.   Treatment plan initiated: Medications  sodium chloride 0.9 % bolus 1,000 mL (1,000 mLs Intravenous New Bag/Given 05/22/13 0342)  ketorolac (TORADOL) 30 MG/ML injection 30 mg (30 mg Intravenous Given 05/22/13 0342)  metoCLOPramide (REGLAN) injection 10 mg (10 mg Intravenous Given 05/22/13 0342)    Results for orders placed during the hospital encounter of 05/22/13  CBC WITH DIFFERENTIAL      Result Value Range   WBC 11.4 (*) 4.0 - 10.5 K/uL   RBC 5.10  4.22 - 5.81 MIL/uL   Hemoglobin 15.6  13.0 - 17.0 g/dL   HCT 50.5  39.7 - 67.3 %   MCV 91.6  78.0 - 100.0 fL   MCH 30.6  26.0 - 34.0 pg   MCHC 33.4  30.0 - 36.0 g/dL   RDW 41.9  37.9 - 02.4 %   Platelets 245  150 - 400 K/uL   Neutrophils Relative % 81 (*) 43 - 77 %   Neutro Abs 9.2 (*) 1.7 - 7.7 K/uL   Lymphocytes Relative 10 (*) 12 - 46 %   Lymphs Abs 1.1  0.7 - 4.0 K/uL   Monocytes Relative 8  3 - 12 %   Monocytes Absolute 0.9  0.1 -  1.0 K/uL   Eosinophils Relative 1  0 - 5 %   Eosinophils Absolute 0.2  0.0 - 0.7 K/uL   Basophils Relative 0  0 - 1 %   Basophils Absolute 0.0  0.0 - 0.1 K/uL  COMPREHENSIVE METABOLIC PANEL      Result Value Range   Sodium 137  137 - 147 mEq/L   Potassium 4.3  3.7 - 5.3 mEq/L   Chloride 100  96 - 112 mEq/L   CO2 26  19 - 32 mEq/L   Glucose, Bld 108 (*) 70 - 99 mg/dL   BUN 15  6 - 23 mg/dL   Creatinine, Ser 0.97  0.50 - 1.35 mg/dL   Calcium 9.1  8.4 - 35.3 mg/dL   Total Protein 7.4  6.0 - 8.3 g/dL   Albumin 3.7  3.5 - 5.2 g/dL   AST 17  0 - 37 U/L   ALT 19  0 - 53 U/L   Alkaline Phosphatase 70  39 - 117 U/L   Total Bilirubin 0.2 (*) 0.3 - 1.2 mg/dL   GFR calc non Af Amer >90  >90 mL/min   GFR calc Af Amer >90  >90 mL/min  LIPASE, BLOOD  Result Value Range   Lipase 40  11 - 59 U/L       Imaging Review Dg Abd Acute W/chest  05/22/2013   CLINICAL DATA:  Constipation with increasing mid abdominal pain.  EXAM: ACUTE ABDOMEN SERIES (ABDOMEN 2 VIEW & CHEST 1 VIEW)  COMPARISON:  09/24/2012.  FINDINGS: There is moderate to large volume of formed stool, compatible with history of constipation. No dilated small bowel to suggest proximal obstruction. No pneumoperitoneum. No abnormal intra-abdominal mass effect or calcification. Cholecystectomy changes. Low volume lungs with interstitial coarsening at the bases. No cardiomegaly. No acute osseous findings.  IMPRESSION: 1. Findings consistent with history of constipation. No bowel obstruction or perforation. 2. Low volume lungs with basilar atelectasis.   Electronically Signed   By: Tiburcio PeaJonathan  Watts M.D.   On: 05/22/2013 03:44      MDM   1. Constipation        Angus Sellereter S Amjad Fikes, PA-C 05/22/13 [phone]

## 2013-05-22 NOTE — ED Notes (Signed)
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

## 2013-05-23 NOTE — ED Provider Notes (Signed)
Medical screening examination/treatment/procedure(s) were performed by non-physician practitioner and as supervising physician I was immediately available for consultation/collaboration.  EKG Interpretation   None        Derwood KaplanAnkit Bellamie Turney, MD 05/23/13 1135

## 2013-05-26 ENCOUNTER — Other Ambulatory Visit: Payer: Managed Care, Other (non HMO)

## 2013-05-26 ENCOUNTER — Other Ambulatory Visit (INDEPENDENT_AMBULATORY_CARE_PROVIDER_SITE_OTHER): Payer: Managed Care, Other (non HMO)

## 2013-05-26 DIAGNOSIS — E291 Testicular hypofunction: Secondary | ICD-10-CM

## 2013-05-26 LAB — TESTOSTERONE: Testosterone: 616.29 ng/dL (ref 350.00–890.00)

## 2013-05-29 ENCOUNTER — Ambulatory Visit (INDEPENDENT_AMBULATORY_CARE_PROVIDER_SITE_OTHER): Payer: Managed Care, Other (non HMO) | Admitting: Endocrinology

## 2013-05-29 ENCOUNTER — Encounter: Payer: Self-pay | Admitting: Endocrinology

## 2013-05-29 VITALS — BP 124/80 | HR 82 | Temp 98.5°F | Ht 64.0 in | Wt 173.0 lb

## 2013-05-29 DIAGNOSIS — E291 Testicular hypofunction: Secondary | ICD-10-CM

## 2013-05-29 MED ORDER — CLOMIPHENE CITRATE 50 MG PO TABS: 25.0000 mg | ORAL_TABLET | Freq: Every day | ORAL | Status: DC

## 2013-05-29 MED ORDER — HYOSCYAMINE SULFATE 0.125 MG SL SUBL: 0.1250 mg | SUBLINGUAL_TABLET | SUBLINGUAL | Status: DC | PRN

## 2013-05-29 NOTE — Patient Instructions (Addendum)
i have sent a prescription to your pharmacy, for the abdominal pain. Please let me or dr Cleta Albertsdaub know if you want to see a specialist for the abdominal pain. Please continue the same clomid.  It would be reasonable to try reducing this to 1/4 tab per day.  Please consider and let me know if you want reduce.   Please return in 1 year.

## 2013-05-29 NOTE — Progress Notes (Signed)
Subjective:    Patient ID: Jonathan Ballard, male    DOB: 1970/06/28, 43 y.o.   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  He says the clomid has helped, but he really can't be certain.   He was seen in ER for abd pain--better now. Past Medical History  Diagnosis Date  . Hypercholesteremia   . Kidney stone   . Sleep apnea     uses C-PAP  . Low testosterone   . Psoriasis     Past Surgical History  Procedure Laterality Date  . Lithotripsy    . Cholecystectomy    . Ureterolithotomy  2008    History   Social History  . Marital Status: Married    Spouse Name: N/A    Number of Children: N/A  . Years of Education: N/A   Occupational History  . 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 Smoking status: Never Smoker   . Smokeless tobacco: Not on file  . Alcohol Use: No  . Drug Use: No  . Sexual Activity: Yes   Other Topics Concern  . Not on file   Social History Narrative  . No narrative on file    Current Outpatient Prescriptions on File Prior to Visit  Medication Sig Dispense Refill  . aspirin EC 81 MG tablet Take 81 mg by mouth daily.      . bisacodyl (DULCOLAX) 5 MG EC tablet Take 5 mg by mouth daily as needed for moderate constipation.      Marland Kitchen buPROPion (WELLBUTRIN XL) 150 MG 24 hr tablet Take 300 mg by mouth daily.      . Cholecalciferol (VITAMIN D3) 1000 UNITS CAPS Take 5,000 Units by mouth every morning.       . docusate sodium (COLACE) 100 MG capsule Take 100 mg by mouth daily as needed for mild constipation.       . fluocinonide (LIDEX) 0.05 % external solution Apply 1 application topically 2 (two) times daily as needed (itching).       Marland Kitchen lactulose (CHRONULAC) 10 GM/15ML solution Take 10 g by mouth 2 (two) times daily as needed for moderate constipation.      . metaxalone (SKELAXIN) 800 MG tablet Take 400-800 mg by mouth 3 (three) times daily as needed. for constipation.      . modafinil (PROVIGIL) 200 MG  tablet Take 150-200 mg by mouth daily. Pt will take 150 mg if take at 12 noon otherwise 200= pt took 150 mg as the last dose      . naproxen sodium (ANAPROX) 220 MG tablet Take 220 mg by mouth 2 (two) times daily as needed (pain).      Marland Kitchen omega-3 acid ethyl esters (LOVAZA) 1 G capsule Take 2 g by mouth daily.      . polyethylene glycol (MIRALAX / GLYCOLAX) packet Take 17 g by mouth daily as needed for moderate constipation.      . polyethylene glycol powder (GLYCOLAX/MIRALAX) powder Take 17 g by mouth daily.  255 g  0  . Probiotic Product (PROBIOTIC DAILY PO) Take 1 capsule by mouth daily.       . rosuvastatin (CRESTOR) 20 MG tablet Take 1 tablet (20 mg total) by mouth every morning.  30 tablet  11  . Salicylic Acid (SALEX) 6 % SHAM Apply 1 application topically daily as needed (itching).       Marland Kitchen senna (SENOKOT) 8.6 MG tablet Take 1 tablet by  mouth daily as needed for constipation.      . triamcinolone (KENALOG) topical spray Apply 1 mg topically 2 (two) times daily as needed (to ears).       . zolpidem (AMBIEN) 10 MG tablet Take 5 mg by mouth at bedtime as needed for sleep.       No current facility-administered medications on file prior to visit.    Allergies  Allergen Reactions  . Zithromax [Azithromycin Dihydrate] Other (See Comments)    Reaction=tongue swells  . Niacin And Related Rash  . Niaspan [Niacin Er] Rash  . Penicillins Rash    Family History  Problem Relation Age of Onset  . Diabetes Father   . Bladder Cancer Father   . Hypothyroidism Father   . Hyperlipidemia Father   . Cancer Other   . Hyperlipidemia Other   . Breast cancer Mother   . Hypothyroidism Mother   . Cancer Mother   . ADD / ADHD Son   . Anxiety disorder Son   . Alzheimer's disease Maternal Grandmother   . Cancer Maternal Grandfather   . Stroke Paternal Grandmother   . Diabetes Paternal Grandmother   . Cancer Paternal Grandmother   . Cancer Paternal Grandfather     BP 124/80  Pulse 82  Temp(Src)  98.5 F (36.9 C) (Oral)  Ht 5\' 4"  (1.626 m)  Wt 173 lb (78.472 kg)  BMI 29.68 kg/m2  SpO2 96%  Review of Systems Denies decreased urinary stream.      Objective:   Physical Exam VITAL SIGNS:  See vs page GENERAL: no distress Ext: no edema.   Lab Results  Component Value Date   TESTOSTERONE 616.29 05/26/2013      Assessment & Plan:  Hypogonadism: well-controlled.  He probably could have a trial of a reduced dosage of clomid. abd pain, uncertain etiology

## 2013-06-04 ENCOUNTER — Encounter: Payer: Self-pay | Admitting: Endocrinology

## 2013-06-04 ENCOUNTER — Other Ambulatory Visit: Payer: Self-pay | Admitting: Endocrinology

## 2013-06-04 DIAGNOSIS — N200 Calculus of kidney: Secondary | ICD-10-CM

## 2013-06-04 DIAGNOSIS — K5900 Constipation, unspecified: Secondary | ICD-10-CM

## 2013-06-04 MED ORDER — KETOROLAC TROMETHAMINE 10 MG PO TABS: 10.0000 mg | ORAL_TABLET | Freq: Four times a day (QID) | ORAL | Status: AC | PRN

## 2013-06-04 MED ORDER — METOCLOPRAMIDE HCL 10 MG PO TABS: 10.0000 mg | ORAL_TABLET | Freq: Four times a day (QID) | ORAL | Status: DC

## 2013-10-07 ENCOUNTER — Encounter: Payer: Self-pay | Admitting: Emergency Medicine

## 2013-10-07 ENCOUNTER — Ambulatory Visit (INDEPENDENT_AMBULATORY_CARE_PROVIDER_SITE_OTHER): Payer: Managed Care, Other (non HMO) | Admitting: Emergency Medicine

## 2013-10-07 VITALS — BP 130/74 | HR 95 | Temp 99.1°F | Resp 16 | Ht 63.5 in | Wt 165.0 lb

## 2013-10-07 DIAGNOSIS — F32A Depression, unspecified: Secondary | ICD-10-CM

## 2013-10-07 DIAGNOSIS — Z0000 Encounter for general adult medical examination without abnormal findings: Secondary | ICD-10-CM

## 2013-10-07 DIAGNOSIS — F3289 Other specified depressive episodes: Secondary | ICD-10-CM

## 2013-10-07 DIAGNOSIS — L408 Other psoriasis: Secondary | ICD-10-CM

## 2013-10-07 DIAGNOSIS — F329 Major depressive disorder, single episode, unspecified: Secondary | ICD-10-CM

## 2013-10-07 DIAGNOSIS — L409 Psoriasis, unspecified: Secondary | ICD-10-CM

## 2013-10-07 DIAGNOSIS — E291 Testicular hypofunction: Secondary | ICD-10-CM

## 2013-10-07 DIAGNOSIS — E785 Hyperlipidemia, unspecified: Secondary | ICD-10-CM

## 2013-10-07 LAB — CBC WITH DIFFERENTIAL/PLATELET
BASOS PCT: 0 % (ref 0–1)
Basophils Absolute: 0 10*3/uL (ref 0.0–0.1)
EOS ABS: 0.1 10*3/uL (ref 0.0–0.7)
Eosinophils Relative: 2 % (ref 0–5)
HEMATOCRIT: 44.3 % (ref 39.0–52.0)
HEMOGLOBIN: 15.1 g/dL (ref 13.0–17.0)
Lymphocytes Relative: 15 % (ref 12–46)
Lymphs Abs: 1.1 10*3/uL (ref 0.7–4.0)
MCH: 29.3 pg (ref 26.0–34.0)
MCHC: 34.1 g/dL (ref 30.0–36.0)
MCV: 85.9 fL (ref 78.0–100.0)
MONOS PCT: 11 % (ref 3–12)
Monocytes Absolute: 0.8 10*3/uL (ref 0.1–1.0)
Neutro Abs: 5.3 10*3/uL (ref 1.7–7.7)
Neutrophils Relative %: 72 % (ref 43–77)
Platelets: 277 10*3/uL (ref 150–400)
RBC: 5.16 MIL/uL (ref 4.22–5.81)
RDW: 14.4 % (ref 11.5–15.5)
WBC: 7.4 10*3/uL (ref 4.0–10.5)

## 2013-10-07 LAB — IFOBT (OCCULT BLOOD): IFOBT: POSITIVE

## 2013-10-07 NOTE — Progress Notes (Signed)
   Subjective:    Patient ID: Jonathan Ballard, male    DOB: 1970-05-13, 43 y.o.   MRN: [id number]  HPI    Review of Systems  Constitutional: Negative.   HENT: Negative.   Eyes: Negative.   Respiratory: Negative.   Cardiovascular: Negative.   Gastrointestinal: Negative.   Endocrine: Negative.   Genitourinary: Negative.   Musculoskeletal: Negative.   Skin: Negative.   Allergic/Immunologic: Negative.   Neurological: Negative.   Hematological: Negative.   Psychiatric/Behavioral: Positive for sleep disturbance, dysphoric mood and decreased concentration.   he has chronic constipation and sees Dr. Elnoria HowardHung. He has a psychiatrist to adjust his medications for depression which include Celexa and Wellbutrin.    Objective:   Physical Exam HEENT exam unremarkable neck supple chest clear heart regular and no murmurs abdomen soft nontender liver spleen not enlarged. There is a large diastases recti. There no lower abdominal masses palpable. Rectal reveals a normal size prostate genital exam is normal extremities without edema skin exam reveals plaque psoriasis the        Assessment & Plan:  Patient is stable at present. Routine labs were done to include testosterone. He is currently on Clomid for this problem. He sees Dr. Everardo AllEllison on regular basis. He went to the care of Dr. Elnoria HowardHung. He also is under the regular care of his psychiatrist. Ref[facility] to help with his lipids

## 2013-10-08 LAB — COMPLETE METABOLIC PANEL WITH GFR
ALK PHOS: 70 U/L (ref 39–117)
ALT: 20 U/L (ref 0–53)
AST: 17 U/L (ref 0–37)
Albumin: 4.2 g/dL (ref 3.5–5.2)
BILIRUBIN TOTAL: 0.4 mg/dL (ref 0.2–1.2)
BUN: 15 mg/dL (ref 6–23)
CO2: 28 mEq/L (ref 19–32)
Calcium: 9.3 mg/dL (ref 8.4–10.5)
Chloride: 106 mEq/L (ref 96–112)
Creat: 0.93 mg/dL (ref 0.50–1.35)
GFR, Est Non African American: >89 mL/min
GLUCOSE: 73 mg/dL (ref 70–99)
Potassium: 4.4 mEq/L (ref 3.5–5.3)
Sodium: 142 mEq/L (ref 135–145)
Total Protein: 6.6 g/dL (ref 6.0–8.3)

## 2013-10-08 LAB — TESTOSTERONE, FREE, TOTAL, SHBG
Sex Hormone Binding: 26 nmol/L (ref 13–71)
TESTOSTERONE FREE: 161.2 pg/mL (ref 47.0–244.0)
TESTOSTERONE-% FREE: 2.5 % (ref 1.6–2.9)
TESTOSTERONE: 647 ng/dL (ref 300–890)

## 2013-10-08 LAB — LIPID PANEL
CHOL/HDL RATIO: 4.4 ratio
CHOLESTEROL: 114 mg/dL (ref 0–200)
HDL: 26 mg/dL — ABNORMAL LOW (ref >39)
LDL Cholesterol: 78 mg/dL (ref 0–99)
Triglycerides: 51 mg/dL (ref <150)
VLDL: 10 mg/dL (ref 0–40)

## 2013-10-08 LAB — PSA, MEDICARE: PSA: 0.5 ng/mL (ref <=4.00)

## 2013-10-25 ENCOUNTER — Other Ambulatory Visit: Payer: Self-pay | Admitting: Emergency Medicine

## 2013-11-19 ENCOUNTER — Institutional Professional Consult (permissible substitution): Payer: Managed Care, Other (non HMO) | Admitting: Cardiology

## 2013-12-26 ENCOUNTER — Institutional Professional Consult (permissible substitution): Payer: Managed Care, Other (non HMO) | Admitting: Interventional Cardiology

## 2014-01-14 ENCOUNTER — Encounter: Payer: Self-pay | Admitting: Cardiology

## 2014-01-14 ENCOUNTER — Ambulatory Visit (INDEPENDENT_AMBULATORY_CARE_PROVIDER_SITE_OTHER): Payer: Managed Care, Other (non HMO) | Admitting: Cardiology

## 2014-01-14 VITALS — BP 142/92 | HR 88 | Ht 64.0 in | Wt 170.0 lb

## 2014-01-14 DIAGNOSIS — E663 Overweight: Secondary | ICD-10-CM

## 2014-01-14 DIAGNOSIS — F329 Major depressive disorder, single episode, unspecified: Secondary | ICD-10-CM

## 2014-01-14 DIAGNOSIS — N529 Male erectile dysfunction, unspecified: Secondary | ICD-10-CM

## 2014-01-14 DIAGNOSIS — E7800 Pure hypercholesterolemia, unspecified: Secondary | ICD-10-CM | POA: Insufficient documentation

## 2014-01-14 DIAGNOSIS — F32A Depression, unspecified: Secondary | ICD-10-CM | POA: Insufficient documentation

## 2014-01-14 DIAGNOSIS — N528 Other male erectile dysfunction: Secondary | ICD-10-CM | POA: Insufficient documentation

## 2014-01-14 DIAGNOSIS — F3289 Other specified depressive episodes: Secondary | ICD-10-CM

## 2014-01-14 MED ORDER — SILDENAFIL CITRATE 20 MG PO TABS
20.0000 mg | ORAL_TABLET | Freq: Every day | ORAL | Status: DC | PRN
Start: 2014-01-14 — End: 2015-01-19

## 2014-01-14 NOTE — Progress Notes (Signed)
[address] [place], [place]  [address] Phone: [phone] Fax:  [phone]  Date:  01/14/2014   ID:  Jonathan Ballard W Toda, DOB [birth year], MRN [id number]  PCP:  Lucilla EdinAUB, STEVE A, MD   History of Present Illness: Jonathan CalamityBrady W Ballard is a 43 y.o. male here for consultation at the request of Dr. Cleta Albertsaub for the evaluation of hyperlipidemia. Has history of psoriasis, hyperlipidemia.  LDL is 78, HDL is 26, triglycerides 51. He is currently taking Crestor.  Has NMR that showed HDL of 9 in the past he states. On Crestor (started Crestor when it first came out on the market). Polymorphism.   Father diabetic, no early CAD thankfully. On Lovaza and ASA 81mg .   Sometimes follows diet. Currently separated. 180 to 148 back up now.  He is a Engineer, drillingretail pharmacist.   Takes low dose propranolol to help with anxiety.  He has also been battling erectile dysfunction. Has been separated recently dating. He is going to discuss with psychiatry. Please see below.   Wt Readings from Last 3 Encounters:  01/14/14 170 lb (77.111 kg)  10/07/13 165 lb (74.844 kg)  05/29/13 173 lb (78.472 kg)     Past Medical History  Diagnosis Date  . Hypercholesteremia   . Kidney stone   . Sleep apnea     uses C-PAP  . Low testosterone   . Psoriasis   . Depression     Past Surgical History  Procedure Laterality Date  . Lithotripsy    . Cholecystectomy    . Ureterolithotomy  2008    Current Outpatient Prescriptions  Medication Sig Dispense Refill  . aspirin EC 81 MG tablet Take 81 mg by mouth daily.      Marland Kitchen. buPROPion (WELLBUTRIN XL) 150 MG 24 hr tablet Take 450 mg by mouth daily.       . Cholecalciferol (VITAMIN D3) 1000 UNITS CAPS Take 5,000 Units by mouth every morning.       . citalopram (CELEXA) 20 MG tablet Take 5 mg by mouth as needed.      . clomiPHENE (CLOMID) 50 MG tablet Take 0.5 tablets (25 mg total) by mouth daily.  30 tablet  6  . CRESTOR 20 MG tablet TAKE 1 TABLET (20 MG TOTAL)  BY MOUTH EVERY MORNING.  30 tablet  10  . docusate sodium (COLACE) 100 MG capsule Take 100 mg by mouth daily as needed for mild constipation.       . fluocinonide (LIDEX) 0.05 % external solution Apply 1 application topically 2 (two) times daily as needed (itching).       . hyoscyamine (LEVSIN SL) 0.125 MG SL tablet Place 1 tablet (0.125 mg total) under the tongue every 4 (four) hours as needed.  30 tablet  3  . ketorolac (TORADOL) 10 MG tablet Take 1 tablet (10 mg total) by mouth every 6 (six) hours as needed.  30 tablet  1  . lactulose (CHRONULAC) 10 GM/15ML solution Take 10 g by mouth 2 (two) times daily as needed for moderate constipation.      Marland Kitchen. MYRBETRIQ 25 MG TB24 tablet       . naproxen sodium (ANAPROX) 220 MG tablet Take 220 mg by mouth 2 (two) times daily as needed (pain).      Marland Kitchen. NUVIGIL 200 MG TABS       . omega-3 acid ethyl esters (LOVAZA) 1 G capsule Take 2 g by mouth daily.      .Marland Kitchen  polyethylene glycol powder (GLYCOLAX/MIRALAX) powder Take 17 g by mouth daily.  255 g  0  . Probiotic Product (PROBIOTIC DAILY PO) Take 1 capsule by mouth daily.       . propranolol (INDERAL) 10 MG tablet Take 10 mg by mouth 3 (three) times daily.      . Salicylic Acid (SALEX) 6 % SHAM Apply 1 application topically daily as needed (itching).       Marland Kitchen senna (SENOKOT) 8.6 MG tablet Take 1 tablet by mouth daily as needed for constipation.      . triamcinolone (KENALOG) topical spray Apply 1 mg topically 2 (two) times daily as needed (to ears).       . zolpidem (AMBIEN) 10 MG tablet Take 5 mg by mouth at bedtime as needed for sleep.       No current facility-administered medications for this visit.   0 Allergies:    Allergies  Allergen Reactions  . Zithromax [Azithromycin Dihydrate] Other (See Comments)    Reaction=tongue swells  . Niacin And Related Rash  . Niaspan [Niacin Er] Rash  . Penicillins Rash    Social History:  The patient  reports that he has never smoked. He does not have any smokeless  tobacco history on file. He reports that he does not drink alcohol or use illicit drugs.   Family History  Problem Relation Age of Onset  . Diabetes Father   . Bladder Cancer Father   . Hypothyroidism Father   . Hyperlipidemia Father   . Cancer Other   . Hyperlipidemia Other   . Breast cancer Mother   . Hypothyroidism Mother   . Cancer Mother   . ADD / ADHD Son   . Anxiety disorder Son   . Alzheimer's disease Maternal Grandmother   . Cancer Maternal Grandfather   . Stroke Paternal Grandmother   . Diabetes Paternal Grandmother   . Cancer Paternal Grandmother   . Cancer Paternal Grandfather   . Hyperlipidemia Brother     ROS:  Please see the history of present illness.   Denies any fevers, chills, orthopnea, PND  All other systems reviewed and negative.   PHYSICAL EXAM: VS:  BP 142/92  Pulse 88  Ht 5\' 4"  (1.626 m)  Wt 170 lb (77.111 kg)  BMI 29.17 kg/m2 Well nourished, well developed, in no acute distress HEENT: normal, [place]/AT, EOMI Neck: no JVD, normal carotid upstroke, no bruit Cardiac:  normal S1, S2; RRR; no murmur Lungs:  clear to auscultation bilaterally, no wheezing, rhonchi or rales Abd: soft, nontender, no hepatomegaly, no bruits Ext: no edema, 2+ distal pulses Skin: warm and dryPsoriatic lesions noted on knees GU: deferred Neuro: no focal abnormalities noted, AAO x 3  EKG:    10/08/13-sinus rhythm, left axis deviation, heart rate 98  ASSESSMENT AND PLAN:  1. Hyperlipidemia- he and father have polymorphism. HDL at one point was 8. Currently excellent control with Crestor, Lovaza. Continue current dosing. HDL is still low, continue with exercise. I would also recommend decreasing wheat/gluten from diet for anti-inflammatory effects. He has done this in the past and this has helped his plaque psoriasis.  2. Erectile dysfunction-may be a side effect from Celexa. He will discuss further with psychiatry. In the meantime, I think it is reasonable to trial Viagra 20 mg  daily as needed. 3. Overweight continue to lose weight. 4. Depression-improved. Working with psychiatry. 5. One-year followup  Signed, Donato Schultz, MD [facility]  01/14/2014 4:23 PM

## 2014-01-14 NOTE — Patient Instructions (Signed)
The current medical regimen is effective;  continue present plan and medications.  Follow up in 1 year with Dr Skains.  You will receive a letter in the mail 2 months before you are due.  Please call us when you receive this letter to schedule your follow up appointment.  

## 2014-04-21 ENCOUNTER — Other Ambulatory Visit: Payer: Self-pay | Admitting: Endocrinology

## 2014-05-20 ENCOUNTER — Other Ambulatory Visit: Payer: Self-pay | Admitting: Endocrinology

## 2014-05-20 NOTE — Telephone Encounter (Signed)
Please refer request to PCP 

## 2014-05-20 NOTE — Telephone Encounter (Signed)
Please advise if ok to refill medication. Pt has not been seen since 06/01/2014. Thanks!

## 2014-07-25 ENCOUNTER — Other Ambulatory Visit: Payer: Self-pay | Admitting: Endocrinology

## 2014-07-27 NOTE — Telephone Encounter (Signed)
Please advise if ok to refill. Pt has not been seen since February of 2015.

## 2014-07-27 NOTE — Telephone Encounter (Signed)
Please route refill request to pcp

## 2014-10-08 ENCOUNTER — Encounter: Payer: Self-pay | Admitting: Emergency Medicine

## 2014-10-15 ENCOUNTER — Encounter: Payer: Managed Care, Other (non HMO) | Admitting: Emergency Medicine

## 2014-11-29 ENCOUNTER — Ambulatory Visit (INDEPENDENT_AMBULATORY_CARE_PROVIDER_SITE_OTHER): Payer: Managed Care, Other (non HMO) | Admitting: Physician Assistant

## 2014-11-29 VITALS — BP 120/80 | HR 84 | Temp 99.2°F | Resp 16 | Ht 64.0 in | Wt 178.0 lb

## 2014-11-29 DIAGNOSIS — R3129 Other microscopic hematuria: Secondary | ICD-10-CM

## 2014-11-29 DIAGNOSIS — R312 Other microscopic hematuria: Secondary | ICD-10-CM | POA: Diagnosis not present

## 2014-11-29 DIAGNOSIS — R1084 Generalized abdominal pain: Secondary | ICD-10-CM | POA: Diagnosis not present

## 2014-11-29 DIAGNOSIS — R197 Diarrhea, unspecified: Secondary | ICD-10-CM

## 2014-11-29 DIAGNOSIS — R109 Unspecified abdominal pain: Secondary | ICD-10-CM

## 2014-11-29 LAB — POCT URINALYSIS DIPSTICK
Bilirubin, UA: NEGATIVE
Glucose, UA: NEGATIVE
Ketones, UA: NEGATIVE
Leukocytes, UA: NEGATIVE
NITRITE UA: NEGATIVE
PH UA: 5
Protein, UA: NEGATIVE
Spec Grav, UA: 1.02
Urobilinogen, UA: 0.2

## 2014-11-29 LAB — COMPREHENSIVE METABOLIC PANEL
ALBUMIN: 3.7 g/dL (ref 3.6–5.1)
ALT: 27 U/L (ref 9–46)
AST: 22 U/L (ref 10–40)
Alkaline Phosphatase: 71 U/L (ref 40–115)
BUN: 9 mg/dL (ref 7–25)
CALCIUM: 9 mg/dL (ref 8.6–10.3)
CHLORIDE: 103 mmol/L (ref 98–110)
CO2: 30 mmol/L (ref 20–31)
CREATININE: 1.07 mg/dL (ref 0.60–1.35)
Glucose, Bld: 88 mg/dL (ref 65–99)
POTASSIUM: 4.3 mmol/L (ref 3.5–5.3)
Sodium: 140 mmol/L (ref 135–146)
TOTAL PROTEIN: 6.5 g/dL (ref 6.1–8.1)
Total Bilirubin: 0.6 mg/dL (ref 0.2–1.2)

## 2014-11-29 LAB — POCT UA - MICROSCOPIC ONLY
CASTS, UR, LPF, POC: NEGATIVE
CRYSTALS, UR, HPF, POC: NEGATIVE
Mucus, UA: NEGATIVE
Yeast, UA: NEGATIVE

## 2014-11-29 LAB — POCT CBC
GRANULOCYTE PERCENT: 66.6 % (ref 37–80)
HEMATOCRIT: 44.1 % (ref 43.5–53.7)
Hemoglobin: 13.7 g/dL — AB (ref 14.1–18.1)
LYMPH, POC: 1 (ref 0.6–3.4)
MCH: 27.5 pg (ref 27–31.2)
MCHC: 31.1 g/dL — AB (ref 31.8–35.4)
MCV: 88.4 fL (ref 80–97)
MID (cbc): 0.5 (ref 0–0.9)
MPV: 6.8 fL (ref 0–99.8)
PLATELET COUNT, POC: 232 10*3/uL (ref 142–424)
POC Granulocyte: 3 (ref 2–6.9)
POC LYMPH PERCENT: 22.3 %L (ref 10–50)
POC MID %: 11.1 %M (ref 0–12)
RBC: 4.98 M/uL (ref 4.69–6.13)
RDW, POC: 15.4 %
WBC: 4.5 10*3/uL — AB (ref 4.6–10.2)

## 2014-11-29 MED ORDER — TAMSULOSIN HCL 0.4 MG PO CAPS: 0.4000 mg | ORAL_CAPSULE | Freq: Every day | ORAL | Status: DC

## 2014-11-29 NOTE — Patient Instructions (Signed)
Your white blood cell count was normal. We are waiting on further labs that will be back tomorrow.  You likely have a kidney stone causing some of your symptoms. Please take the flomax 30 minutes after the same meal daily for the next 4 weeks or until stone passage. Be sure to see your urologist upon return from [place].  You can keep taking the imodium for the diarrhea. If your diarrhea persists another 3-4 days, if you notice blood in the stool or start having severe abdominal pain be sure to see someone right away.    Viral Gastroenteritis Viral gastroenteritis is also known as stomach flu. This condition affects the stomach and intestinal tract. It can cause sudden diarrhea and vomiting. The illness typically lasts 3 to 8 days. Most people develop an immune response that eventually gets rid of the virus. While this natural response develops, the virus can make you quite ill. CAUSES  Many different viruses can cause gastroenteritis, such as rotavirus or noroviruses. You can catch one of these viruses by consuming contaminated food or water. You may also catch a virus by sharing utensils or other personal items with an infected person or by touching a contaminated surface. SYMPTOMS  The most common symptoms are diarrhea and vomiting. These problems can cause a severe loss of body fluids (dehydration) and a body salt (electrolyte) imbalance. Other symptoms may include:  Fever.  Headache.  Fatigue.  Abdominal pain. DIAGNOSIS  Your caregiver can usually diagnose viral gastroenteritis based on your symptoms and a physical exam. A stool sample may also be taken to test for the presence of viruses or other infections. TREATMENT  This illness typically goes away on its own. Treatments are aimed at rehydration. The most serious cases of viral gastroenteritis involve vomiting so severely that you are not able to keep fluids down. In these cases, fluids must be given through an intravenous line  (IV). HOME CARE INSTRUCTIONS   Drink enough fluids to keep your urine clear or pale yellow. Drink small amounts of fluids frequently and increase the amounts as tolerated.  Ask your caregiver for specific rehydration instructions.  Avoid:  Foods high in sugar.  Alcohol.  Carbonated drinks.  Tobacco.  Juice.  Caffeine drinks.  Extremely hot or cold fluids.  Fatty, greasy foods.  Too much intake of anything at one time.  Dairy products until 24 to 48 hours after diarrhea stops.  You may consume probiotics. Probiotics are active cultures of beneficial bacteria. They may lessen the amount and number of diarrheal stools in adults. Probiotics can be found in yogurt with active cultures and in supplements.  Wash your hands well to avoid spreading the virus.  Only take over-the-counter or prescription medicines for pain, discomfort, or fever as directed by your caregiver. Do not give aspirin to children. Antidiarrheal medicines are not recommended.  Ask your caregiver if you should continue to take your regular prescribed and over-the-counter medicines.  Keep all follow-up appointments as directed by your caregiver. SEEK IMMEDIATE MEDICAL CARE IF:   You are unable to keep fluids down.  You do not urinate at least once every 6 to 8 hours.  You develop shortness of breath.  You notice blood in your stool or vomit. This may look like coffee grounds.  You have abdominal pain that increases or is concentrated in one small area (localized).  You have persistent vomiting or diarrhea.  You have a fever.  The patient is a child younger than 3 months, and  he or she has a fever.  The patient is a child older than 3 months, and he or she has a fever and persistent symptoms.  The patient is a child older than 3 months, and he or she has a fever and symptoms suddenly get worse.  The patient is a baby, and he or she has no tears when crying. MAKE SURE YOU:   Understand these  instructions.  Will watch your condition.  Will get help right away if you are not doing well or get worse. Document Released: 04/03/2005 Document Revised: 06/26/2011 Document Reviewed: 01/18/2011 [facility] Patient Information 2015 [place], [place]. This information is not intended to replace advice given to you by your health care provider. Make sure you discuss any questions you have with your health care provider.   Kidney Stones Kidney stones (urolithiasis) are deposits that form inside your kidneys. The intense pain is caused by the stone moving through the urinary tract. When the stone moves, the ureter goes into spasm around the stone. The stone is usually passed in the urine.  CAUSES   A disorder that makes certain neck glands produce too much parathyroid hormone (primary hyperparathyroidism).  A buildup of uric acid crystals, similar to gout in your joints.  Narrowing (stricture) of the ureter.  A kidney obstruction present at birth (congenital obstruction).  Previous surgery on the kidney or ureters.  Numerous kidney infections. SYMPTOMS   Feeling sick to your stomach (nauseous).  Throwing up (vomiting).  Blood in the urine (hematuria).  Pain that usually spreads (radiates) to the groin.  Frequency or urgency of urination. DIAGNOSIS   Taking a history and physical exam.  Blood or urine tests.  CT scan.  Occasionally, an examination of the inside of the urinary bladder (cystoscopy) is performed. TREATMENT   Observation.  Increasing your fluid intake.  Extracorporeal shock wave lithotripsy--This is a noninvasive procedure that uses shock waves to break up kidney stones.  Surgery may be needed if you have severe pain or persistent obstruction. There are various surgical procedures. Most of the procedures are performed with the use of small instruments. Only small incisions are needed to accommodate these instruments, so recovery time is minimized. The size,  location, and chemical composition are all important variables that will determine the proper choice of action for you. Talk to your health care provider to better understand your situation so that you will minimize the risk of injury to yourself and your kidney.  HOME CARE INSTRUCTIONS   Drink enough water and fluids to keep your urine clear or pale yellow. This will help you to pass the stone or stone fragments.  Strain all urine through the provided strainer. Keep all particulate matter and stones for your health care provider to see. The stone causing the pain may be as small as a grain of salt. It is very important to use the strainer each and every time you pass your urine. The collection of your stone will allow your health care provider to analyze it and verify that a stone has actually passed. The stone analysis will often identify what you can do to reduce the incidence of recurrences.  Only take over-the-counter or prescription medicines for pain, discomfort, or fever as directed by your health care provider.  Make a follow-up appointment with your health care provider as directed.  Get follow-up X-rays if required. The absence of pain does not always mean that the stone has passed. It may have only stopped moving. If the  urine remains completely obstructed, it can cause loss of kidney function or even complete destruction of the kidney. It is your responsibility to make sure X-rays and follow-ups are completed. Ultrasounds of the kidney can show blockages and the status of the kidney. Ultrasounds are not associated with any radiation and can be performed easily in a matter of minutes. SEEK MEDICAL CARE IF:  You experience pain that is progressive and unresponsive to any pain medicine you have been prescribed. SEEK IMMEDIATE MEDICAL CARE IF:   Pain cannot be controlled with the prescribed medicine.  You have a fever or shaking chills.  The severity or intensity of pain increases over  18 hours and is not relieved by pain medicine.  You develop a new onset of abdominal pain.  You feel faint or pass out.  You are unable to urinate. MAKE SURE YOU:   Understand these instructions.  Will watch your condition.  Will get help right away if you are not doing well or get worse. Document Released: 04/03/2005 Document Revised: 12/04/2012 Document Reviewed: 09/04/2012 [facility] Patient Information 2015 [place], [place]. This information is not intended to replace advice given to you by your health care provider. Make sure you discuss any questions you have with your health care provider.

## 2014-11-29 NOTE — Progress Notes (Signed)
Subjective:    Patient ID: Jonathan Ballard, male    DOB: 01-05-1971, 44 y.o.   MRN: [id number]  Chief Complaint  Patient presents with  . Diarrhea    x 3 days  . Fatigue    x 3 days  . Back Pain    right side   Medications, allergies, past medical history, surgical history, family history, social history and problem list reviewed and updated.  HPI  3 yom presents with diarrhea, fatigue, right sided low back pain.   Diarrhea started 3 days ago. Approx 5 episodes day. Non bloody. No sick contacts. No fevers, chills. No abd pain. No n/v. Taking imodium several times day which has helped. He is flying to [place] in 2 days for vacation.   Also mentions right sided low back pain since yest. Persistent. No assoc dysuria, fevers, chills, hematuria. No trauma. No aggravating activities. Hx multiple kidney stones in past.   Review of Systems See HPI.     Objective:   Physical Exam  Constitutional: He is oriented to person, place, and time. He appears well-developed and well-nourished.  Non-toxic appearance. He does not have a sickly appearance. He does not appear ill. No distress.  BP 120/80 mmHg  Pulse 84  Temp(Src) 99.2 F (37.3 C) (Oral)  Resp 16  Ht  (1.626 m)  Wt 178 lb (80.74 kg)  BMI 30.54 kg/m2  SpO2 96%   Cardiovascular: Normal rate, regular rhythm and normal heart sounds.   Pulmonary/Chest: Effort normal and breath sounds normal. No tachypnea.  Abdominal: Soft. Normal appearance and bowel sounds are normal. There is generalized tenderness. There is rebound. There is no rigidity, no guarding, no tenderness at McBurney's point and negative Murphy's sign.  Mild generalized abd ttp. Positive right sided cva tenderness.   Neurological: He is alert and oriented to person, place, and time.    Results for orders placed or performed in visit on 11/29/14  POCT urinalysis dipstick  Result Value Ref Range   Color, UA yellow    Clarity, UA clear    Glucose, UA neg    Bilirubin, UA neg    Ketones, UA neg    Spec Grav, UA 1.020    Blood, UA small    pH, UA 5.0    Protein, UA neg    Urobilinogen, UA 0.2    Nitrite, UA neg    Leukocytes, UA Negative Negative  POCT UA - Microscopic Only  Result Value Ref Range   WBC, Ur, HPF, POC 3-6    RBC, urine, microscopic 1-4    Bacteria, U Microscopic trace    Mucus, UA neg    Epithelial cells, urine per micros 1-2    Crystals, Ur, HPF, POC neg    Casts, Ur, LPF, POC neg    Yeast, UA neg   POCT CBC  Result Value Ref Range   WBC 4.5 (A) 4.6 - 10.2 K/uL   Lymph, poc 1.0 0.6 - 3.4   POC LYMPH PERCENT 22.3 10 - 50 %L   MID (cbc) 0.5 0 - 0.9   POC MID % 11.1 0 - 12 %M   POC Granulocyte 3.0 2 - 6.9   Granulocyte percent 66.6 37 - 80 %G   RBC 4.98 4.69 - 6.13 M/uL   Hemoglobin 13.7 (A) 14.1 - 18.1 g/dL   HCT, POC 40.9 81.1 - 53.7 %   MCV 88.4 80 - 97 fL   MCH, POC 27.5 27 - 31.2 pg  MCHC 31.1 (A) 31.8 - 35.4 g/dL   RDW, POC 16.1 %   Platelet Count, POC 232 142 - 424 K/uL   MPV 6.8 0 - 99.8 fL      Assessment & Plan:   Diarrhea - Plan: POCT urinalysis dipstick, POCT UA - Microscopic Only, POCT CBC, Comprehensive metabolic panel  Right flank pain - Plan: tamsulosin (FLOMAX) 0.4 MG CAPS capsule  Generalized abdominal discomfort - Plan: Comprehensive metabolic panel  Hematuria, microscopic - Plan: tamsulosin (FLOMAX) 0.4 MG CAPS capsule --cbc normal, awaiting cmp --possible right nephrolithiasis with cva tenderness and small amnt blood, pt has hx multiple stones in past --declines strainer as he is going on vacation in 2 days, flomax qd to help passage, instructed to f/u with urologist when he returns to area in one week, seek care on vacation if pain worsens or with trouble urinating --has Toradol at home which he plans to use for pain --suspect viral gastroenteritis as cause of diarrhea with no blood in stool, no leukocytosis, no severe abd pain --> continue imodium prn --rtc 3-4 days if diarrhea  persists or worsens for stool samples/culture  Donnajean Lopes, PA-C Physician Assistant-Certified [facility]  11/29/2014 2:16 PM

## 2015-01-19 ENCOUNTER — Encounter: Payer: Self-pay | Admitting: Emergency Medicine

## 2015-01-19 ENCOUNTER — Other Ambulatory Visit: Payer: Self-pay | Admitting: Interventional Cardiology

## 2015-01-21 ENCOUNTER — Encounter: Payer: Self-pay | Admitting: Emergency Medicine

## 2015-01-21 ENCOUNTER — Ambulatory Visit (INDEPENDENT_AMBULATORY_CARE_PROVIDER_SITE_OTHER): Payer: Managed Care, Other (non HMO) | Admitting: Emergency Medicine

## 2015-01-21 VITALS — BP 121/78 | HR 70 | Temp 98.4°F | Resp 16 | Ht 64.0 in | Wt 176.4 lb

## 2015-01-21 DIAGNOSIS — F329 Major depressive disorder, single episode, unspecified: Secondary | ICD-10-CM | POA: Diagnosis not present

## 2015-01-21 DIAGNOSIS — E291 Testicular hypofunction: Secondary | ICD-10-CM | POA: Diagnosis not present

## 2015-01-21 DIAGNOSIS — F32A Depression, unspecified: Secondary | ICD-10-CM

## 2015-01-21 DIAGNOSIS — Z202 Contact with and (suspected) exposure to infections with a predominantly sexual mode of transmission: Secondary | ICD-10-CM

## 2015-01-21 DIAGNOSIS — K6289 Other specified diseases of anus and rectum: Secondary | ICD-10-CM | POA: Diagnosis not present

## 2015-01-21 DIAGNOSIS — E785 Hyperlipidemia, unspecified: Secondary | ICD-10-CM | POA: Diagnosis not present

## 2015-01-21 LAB — CBC WITH DIFFERENTIAL/PLATELET
Basophils Absolute: 0 10*3/uL (ref 0.0–0.1)
Basophils Relative: 0 % (ref 0–1)
Eosinophils Absolute: 0.1 10*3/uL (ref 0.0–0.7)
Eosinophils Relative: 2 % (ref 0–5)
HEMATOCRIT: 44.6 % (ref 39.0–52.0)
HEMOGLOBIN: 15 g/dL (ref 13.0–17.0)
LYMPHS PCT: 18 % (ref 12–46)
Lymphs Abs: 1.3 10*3/uL (ref 0.7–4.0)
MCH: 29.5 pg (ref 26.0–34.0)
MCHC: 33.6 g/dL (ref 30.0–36.0)
MCV: 87.6 fL (ref 78.0–100.0)
MONO ABS: 0.5 10*3/uL (ref 0.1–1.0)
MONOS PCT: 7 % (ref 3–12)
MPV: 9.6 fL (ref 8.6–12.4)
NEUTROS ABS: 5.4 10*3/uL (ref 1.7–7.7)
Neutrophils Relative %: 73 % (ref 43–77)
Platelets: 261 10*3/uL (ref 150–400)
RBC: 5.09 MIL/uL (ref 4.22–5.81)
RDW: 14.2 % (ref 11.5–15.5)
WBC: 7.4 10*3/uL (ref 4.0–10.5)

## 2015-01-21 LAB — COMPLETE METABOLIC PANEL WITH GFR
ALBUMIN: 4 g/dL (ref 3.6–5.1)
ALK PHOS: 76 U/L (ref 40–115)
ALT: 24 U/L (ref 9–46)
AST: 19 U/L (ref 10–40)
BILIRUBIN TOTAL: 0.5 mg/dL (ref 0.2–1.2)
BUN: 19 mg/dL (ref 7–25)
CALCIUM: 9 mg/dL (ref 8.6–10.3)
CO2: 29 mmol/L (ref 20–31)
Chloride: 106 mmol/L (ref 98–110)
Creat: 0.91 mg/dL (ref 0.60–1.35)
GFR, Est African American: >89 mL/min (ref >=60)
Glucose, Bld: 85 mg/dL (ref 65–99)
POTASSIUM: 4.4 mmol/L (ref 3.5–5.3)
Sodium: 141 mmol/L (ref 135–146)
TOTAL PROTEIN: 7.1 g/dL (ref 6.1–8.1)

## 2015-01-21 LAB — POCT URINALYSIS DIP (MANUAL ENTRY)
Bilirubin, UA: NEGATIVE
Glucose, UA: NEGATIVE
Ketones, POC UA: NEGATIVE
Leukocytes, UA: NEGATIVE
NITRITE UA: NEGATIVE
PH UA: 5
PROTEIN UA: NEGATIVE
Spec Grav, UA: 1.02
UROBILINOGEN UA: 0.2

## 2015-01-21 LAB — POC MICROSCOPIC URINALYSIS (UMFC): Mucus: ABSENT

## 2015-01-21 LAB — HEPATITIS C ANTIBODY: HCV AB: NEGATIVE

## 2015-01-21 LAB — LIPID PANEL
Cholesterol: 148 mg/dL (ref 125–200)
HDL: 20 mg/dL — AB (ref >=40)
LDL Cholesterol: 100 mg/dL (ref <130)
Total CHOL/HDL Ratio: 7.4 Ratio — ABNORMAL HIGH (ref <=5.0)
Triglycerides: 140 mg/dL (ref <150)
VLDL: 28 mg/dL (ref <30)

## 2015-01-21 LAB — HIV ANTIBODY (ROUTINE TESTING W REFLEX): HIV: NONREACTIVE

## 2015-01-21 MED ORDER — HYDROCORTISONE ACETATE 30 MG RE SUPP: RECTAL | Status: DC

## 2015-01-21 NOTE — Progress Notes (Addendum)
Subjective:  This chart was scribed for Collene Gobble, MD by Veverly Fells, at [facility].  This patient was seen in room 21 and the patient's care was started at 11:43 AM.   Chief Complaint  Patient presents with  . Follow-up    with labwork  . Hemorrhoids    x 1 week     Patient ID: Jonathan Ballard, male    DOB: 1970/05/11, 44 y.o.   MRN: [id number]  HPI  HPI Comments: Jonathan Ballard is a 44 y.o. male who presents to the [facility] for a follow up/physical. Patient would like a lipid profile today as well as blood work.   Home life/ work life: Patient states that he is currently dating someone in [place].  He has custody of his children every other week but states that it has not been totally settled.  Patient states that work is doing "okay."     Patient Active Problem List   Diagnosis Date Noted  . Pure hypercholesterolemia 01/14/2014  . Other male erectile dysfunction 01/14/2014  . Overweight 01/14/2014  . Depression 01/14/2014  . Other testicular hypofunction 03/07/2012  . Psoriasis 03/04/2012  . Hyperlipidemia 09/12/2011  . Gout 09/12/2011  . Kidney stones 09/12/2011  . Insomnia 09/12/2011  . Low HDL (under 40) 11/08/2010   Past Medical History  Diagnosis Date  . Hypercholesteremia   . Kidney stone   . Sleep apnea     uses C-PAP  . Low testosterone   . Psoriasis   . Depression    Past Surgical History  Procedure Laterality Date  . Lithotripsy    . Cholecystectomy    . Ureterolithotomy  2008   Allergies  Allergen Reactions  . Zithromax [Azithromycin Dihydrate] Other (See Comments)    Reaction=tongue swells  . Niacin And Related Rash  . Niaspan [Niacin Er] Rash  . Penicillins Rash   Prior to Admission medications   Medication Sig Start Date End Date Taking? Authorizing Provider  aspirin EC 81 MG tablet Take 81 mg by mouth daily.    Historical Provider, MD  bisacodyl (DULCOLAX) 5 MG EC tablet  Take 5 mg by mouth daily as needed for moderate constipation.    Historical Provider, MD  buPROPion (WELLBUTRIN XL) 150 MG 24 hr tablet Take 450 mg by mouth daily.     Historical Provider, MD  Cholecalciferol (VITAMIN D3) 1000 UNITS CAPS Take 5,000 Units by mouth every morning.     Historical Provider, MD  citalopram (CELEXA) 20 MG tablet Take 5 mg by mouth as needed.    Historical Provider, MD  clomiPHENE (CLOMID) 50 MG tablet Take 0.5 tablets (25 mg total) by mouth daily. 05/29/13   Romero Belling, MD  CRESTOR 20 MG tablet TAKE 1 TABLET (20 MG TOTAL) BY MOUTH EVERY MORNING.    Heather M Marte, PA-C  fluocinonide (LIDEX) 0.05 % external solution Apply 1 application topically 2 (two) times daily as needed (itching).     Historical Provider, MD  hyoscyamine (LEVSIN SL) 0.125 MG SL tablet PLACE 1 TABLET (0.125 MG TOTAL) UNDER THE TONGUE EVERY 4 (FOUR) HOURS AS NEEDED. 04/22/14   Romero Belling, MD  ketorolac (TORADOL) 10 MG tablet Take 1 tablet (10 mg total) by mouth every 6 (six) hours as needed. 06/04/13   Romero Belling, MD  lactulose (CHRONULAC) 10 GM/15ML solution Take 10 g by mouth 2 (two) times daily as needed for moderate constipation.    Historical  Provider, MD  naproxen sodium (ANAPROX) 220 MG tablet Take 220 mg by mouth 2 (two) times daily as needed (pain).    Historical Provider, MD  NUVIGIL 200 MG TABS  12/29/13   Historical Provider, MD  omega-3 acid ethyl esters (LOVAZA) 1 G capsule Take 2 g by mouth daily. 09/24/12   Collene Gobble, MD  polyethylene glycol powder (GLYCOLAX/MIRALAX) powder Take 17 g by mouth daily. 05/22/13   Ivonne Andrew, PA-C  Probiotic Product (PROBIOTIC DAILY PO) Take 1 capsule by mouth daily.     Historical Provider, MD  propranolol (INDERAL) 10 MG tablet Take 10 mg by mouth 3 (three) times daily.    Historical Provider, MD  Salicylic Acid (SALEX) 6 % SHAM Apply 1 application topically daily as needed (itching).     Historical Provider, MD  sildenafil (REVATIO) 20 MG tablet TAKE  1 TABLET (20 MG TOTAL) BY MOUTH DAILY AS NEEDED. 01/19/15   Corky Crafts, MD  tamsulosin (FLOMAX) 0.4 MG CAPS capsule Take 1 capsule (0.4 mg total) by mouth daily. 11/29/14   Raelyn Ensign, PA  triamcinolone (KENALOG) topical spray Apply 1 mg topically 2 (two) times daily as needed (to ears).     Historical Provider, MD  zolpidem (AMBIEN) 10 MG tablet Take 5 mg by mouth at bedtime as needed for sleep.    Historical Provider, MD   Social History   Social History  . Marital Status: Married    Spouse Name: N/A  . Number of Children: N/A  . Years of Education: N/A   Occupational History  . 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 Smoking status: Never Smoker   . Smokeless tobacco: Not on file  . Alcohol Use: No  . Drug Use: No  . Sexual Activity: Yes   Other Topics Concern  . Not on file   Social History Narrative   Education: [employer]. Separated.         Review of Systems  Constitutional: Negative for fever and chills.  Eyes: Negative for pain, redness and itching.  Gastrointestinal: Negative for nausea and vomiting.  Musculoskeletal: Negative for neck pain and neck stiffness.       Objective:   Physical Exam  Filed Vitals:   01/21/15 1125  BP: 121/78  Pulse: 70  Temp: 98.4 F (36.9 C)  TempSrc: Oral  Resp: 16  Height: 5\' 4"  (1.626 m)  Weight: 176 lb 6.4 oz (80.015 kg)    CONSTITUTIONAL: Well developed/well nourished HEAD: Normocephalic/atraumatic EYES: EOMI/PERRL ENMT: Mucous membranes moist NECK: supple no meningeal signs SPINE/BACK:entire spine nontender CV: S1/S2 noted, no murmurs/rubs/gallops noted LUNGS: Lungs are clear to auscultation bilaterally, no apparent distress ABDOMEN: soft,r, no rebound or guarding, bowel sounds noted throughout abdomen no specific areas of tenderness GU:no cva tenderness NEURO: Pt is awake/alert/appropriate, moves all extremitiesx4.  No facial droop.   EXTREMITIES: pulses normal/equal, full  ROM SKIN: warm, color normal PSYCH: no abnormalities of mood noted, alert and oriented to situation Anal exam: mild redness around the anus but his prostate is normal.   Results for orders placed or performed in visit on 01/21/15  POCT urinalysis dipstick  Result Value Ref Range   Color, UA yellow yellow   Clarity, UA clear clear   Glucose, UA negative negative   Bilirubin, UA negative negative   Ketones, POC UA negative negative   Spec Grav, UA 1.020    Blood, UA small (A) negative   pH, UA 5.0  Protein Ur, POC negative negative   Urobilinogen, UA 0.2    Nitrite, UA Negative Negative   Leukocytes, UA Negative Negative  POCT Microscopic Urinalysis ([facility])  Result Value Ref Range   WBC,UR,HPF,POC None None WBC/hpf   RBC,UR,HPF,POC None None RBC/hpf   Bacteria None None   Mucus Absent Absent   Epithelial Cells, UR Per Microscopy None None cells/hpf        Assessment & Plan:   Routine labs were done today. I gave him Anusol HC suppositories to use. He will return to clinic to finish the rest of his physical examination. He will get his flu shot at work.I personally performed the services described in this documentation, which was scribed in my presence. The recorded information has been reviewed and is accurate.

## 2015-01-22 LAB — HSV(HERPES SIMPLEX VRS) I + II AB-IGG

## 2015-01-22 LAB — RPR

## 2015-01-26 LAB — GC/CHLAMYDIA PROBE AMP
CT PROBE, AMP APTIMA: NEGATIVE
GC PROBE AMP APTIMA: NEGATIVE

## 2015-01-26 NOTE — Progress Notes (Signed)
Results are still pending.

## 2015-04-01 ENCOUNTER — Ambulatory Visit (INDEPENDENT_AMBULATORY_CARE_PROVIDER_SITE_OTHER): Payer: Managed Care, Other (non HMO) | Admitting: Emergency Medicine

## 2015-04-01 ENCOUNTER — Encounter: Payer: Self-pay | Admitting: Emergency Medicine

## 2015-04-01 VITALS — BP 110/72 | HR 64 | Temp 98.1°F | Resp 16 | Ht 64.0 in | Wt 178.8 lb

## 2015-04-01 DIAGNOSIS — F32A Depression, unspecified: Secondary | ICD-10-CM

## 2015-04-01 DIAGNOSIS — F329 Major depressive disorder, single episode, unspecified: Secondary | ICD-10-CM | POA: Diagnosis not present

## 2015-04-01 DIAGNOSIS — E785 Hyperlipidemia, unspecified: Secondary | ICD-10-CM

## 2015-04-01 MED ORDER — ICOSAPENT ETHYL 1 G PO CAPS: 2.0000 | ORAL_CAPSULE | Freq: Two times a day (BID) | ORAL | Status: DC

## 2015-04-01 MED ORDER — ROSUVASTATIN CALCIUM 20 MG PO TABS: ORAL_TABLET | ORAL | Status: DC

## 2015-04-01 NOTE — Progress Notes (Signed)
Subjective:  This chart was scribed for Collene Gobble, MD by Veverly Fells, at [facility].  This patient was seen in room 21 and the patient's care was started at 11:00 AM.    Patient ID: Jonathan Ballard, male    DOB: 02/22/1971, 44 y.o.   MRN: [id number] Chief Complaint  Patient presents with  . Follow-up    6 months from CPE  . Medication Refill    crestor and lovaza    HPI HPI Comments: Jonathan Ballard is a 44 y.o. male who presents to the [facility] for a follow up as well as medication refill.  Patient denies chest pain/ SOB.   Depression: He states that he is at 50% with depression.  He feels that he has "triggers" at times.  He sees a psychiatrist and takes Propanolol when he needs to.     Home/family: Patient states that his parents are here for the winter and are helping him get through the holidays with his children (15&28 years old). He will be off for 10 days over Christmas time.  He is planning on going on day trips with his family.    Patient Active Problem List   Diagnosis Date Noted  . Pure hypercholesterolemia 01/14/2014  . Other male erectile dysfunction 01/14/2014  . Overweight 01/14/2014  . Depression 01/14/2014  . Other testicular hypofunction 03/07/2012  . Psoriasis 03/04/2012  . Hyperlipidemia 09/12/2011  . Gout 09/12/2011  . Kidney stones 09/12/2011  . Insomnia 09/12/2011  . Low HDL (under 40) 11/08/2010   Past Medical History  Diagnosis Date  . Hypercholesteremia   . Kidney stone   . Sleep apnea     uses C-PAP  . Low testosterone   . Psoriasis   . Depression    Past Surgical History  Procedure Laterality Date  . Lithotripsy    . Cholecystectomy    . Ureterolithotomy  2008   Allergies  Allergen Reactions  . Zithromax [Azithromycin Dihydrate] Other (See Comments)    Reaction=tongue swells  . Niacin And Related Rash  . Niaspan [Niacin Er] Rash  . Penicillins Rash   Prior to  Admission medications   Medication Sig Start Date End Date Taking? Authorizing Provider  aspirin EC 81 MG tablet Take 81 mg by mouth daily.    Historical Provider, MD  bisacodyl (DULCOLAX) 5 MG EC tablet Take 5 mg by mouth daily as needed for moderate constipation.    Historical Provider, MD  buPROPion (WELLBUTRIN XL) 150 MG 24 hr tablet Take 450 mg by mouth daily.     Historical Provider, MD  Cholecalciferol (VITAMIN D3) 1000 UNITS CAPS Take 5,000 Units by mouth every morning.     Historical Provider, MD  citalopram (CELEXA) 20 MG tablet Take 5 mg by mouth as needed.    Historical Provider, MD  clomiPHENE (CLOMID) 50 MG tablet Take 0.5 tablets (25 mg total) by mouth daily. 05/29/13   Romero Belling, MD  CRESTOR 20 MG tablet TAKE 1 TABLET (20 MG TOTAL) BY MOUTH EVERY MORNING.    Heather M Marte, PA-C  fluocinonide (LIDEX) 0.05 % external solution Apply 1 application topically 2 (two) times daily as needed (itching).     Historical Provider, MD  HYDROCORTISONE ACE, RECTAL, 30 MG SUPP ! Per rectum twice a day 01/21/15   Collene Gobble, MD  hyoscyamine (LEVSIN SL) 0.125 MG SL tablet PLACE 1 TABLET (0.125 MG TOTAL) UNDER THE TONGUE EVERY 4 (  FOUR) HOURS AS NEEDED. 04/22/14   Romero Belling, MD  ketorolac (TORADOL) 10 MG tablet Take 1 tablet (10 mg total) by mouth every 6 (six) hours as needed. 06/04/13   Romero Belling, MD  lactulose (CHRONULAC) 10 GM/15ML solution Take 10 g by mouth 2 (two) times daily as needed for moderate constipation.    Historical Provider, MD  naproxen sodium (ANAPROX) 220 MG tablet Take 220 mg by mouth 2 (two) times daily as needed (pain).    Historical Provider, MD  NUVIGIL 200 MG TABS  12/29/13   Historical Provider, MD  omega-3 acid ethyl esters (LOVAZA) 1 G capsule Take 2 g by mouth daily. 09/24/12   Collene Gobble, MD  polyethylene glycol powder (GLYCOLAX/MIRALAX) powder Take 17 g by mouth daily. 05/22/13   Ivonne Andrew, PA-C  Probiotic Product (PROBIOTIC DAILY PO) Take 1 capsule by mouth  daily.     Historical Provider, MD  propranolol (INDERAL) 10 MG tablet Take 10 mg by mouth 3 (three) times daily.    Historical Provider, MD  Salicylic Acid (SALEX) 6 % SHAM Apply 1 application topically daily as needed (itching).     Historical Provider, MD  sildenafil (REVATIO) 20 MG tablet TAKE 1 TABLET (20 MG TOTAL) BY MOUTH DAILY AS NEEDED. Patient not taking: Reported on 01/21/2015 01/19/15   Corky Crafts, MD  tamsulosin (FLOMAX) 0.4 MG CAPS capsule Take 1 capsule (0.4 mg total) by mouth daily. 11/29/14   Raelyn Ensign, PA  triamcinolone (KENALOG) topical spray Apply 1 mg topically 2 (two) times daily as needed (to ears).     Historical Provider, MD  zolpidem (AMBIEN) 10 MG tablet Take 5 mg by mouth at bedtime as needed for sleep.    Historical Provider, MD   Social History   Social History  . Marital Status: Married    Spouse Name: N/A  . Number of Children: N/A  . Years of Education: N/A   Occupational History  . 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 Smoking status: Never Smoker   . Smokeless tobacco: Not on file  . Alcohol Use: No  . Drug Use: No  . Sexual Activity: Yes   Other Topics Concern  . Not on file   Social History Narrative   Education: [employer]. Separated.     Review of Systems  Constitutional: Negative for fever and chills.  Eyes: Negative for pain, redness and itching.  Respiratory: Negative for cough and choking.   Gastrointestinal: Negative for nausea and vomiting.  Musculoskeletal: Negative for neck pain and neck stiffness.  Neurological: Negative for seizures and speech difficulty.       Objective:   Physical Exam Filed Vitals:   04/01/15 1037  BP: 110/72  Pulse: 64  Temp: 98.1 F (36.7 C)  TempSrc: Oral  Resp: 16  Height:  (1.626 m)  Weight: 178 lb 12.8 oz (81.103 kg)  SpO2: 96%     CONSTITUTIONAL: Well developed/well nourished HEAD: Normocephalic/atraumatic EYES: EOMI/PERRL ENMT: Mucous membranes  moist NECK: supple no meningeal signs SPINE/BACK:entire spine nontender CV: S1/S2 noted, no murmurs/rubs/gallops noted LUNGS: Lungs are clear to auscultation bilaterally, no apparent distress ABDOMEN: soft, nontender, no rebound or guarding, bowel sounds noted throughout abdomen GU:no cva tenderness NEURO: Pt is awake/alert/appropriate, moves all extremitiesx4.  No facial droop.   EXTREMITIES: pulses normal/equal, full ROM SKIN: warm, color normal PSYCH: no abnormalities of mood noted, alert and oriented to situation     Assessment & Plan:  Meds  were refilled. He is still struggling with his separation from his wife. He has a lawsuit regarding the other gentleman involved with his divorce. He will continue on his lipid-lowering drugs. I changed his low Neomia DearVoss to a different patient will product to see how that works.I personally performed the services described in this documentation, which was scribed in my presence. The recorded information has been reviewed and is accurate.

## 2015-04-06 ENCOUNTER — Telehealth: Payer: Self-pay

## 2015-04-06 NOTE — Telephone Encounter (Signed)
PA completed for Vascepa 1 GM on covermymeds for pt's hyperlipidemia. Pt has tried/failed Lovaza. PA pending.

## 2015-04-09 NOTE — Telephone Encounter (Signed)
PA was approved. Notified pharm. 

## 2015-07-28 ENCOUNTER — Encounter: Payer: Self-pay | Admitting: Emergency Medicine

## 2015-08-02 IMAGING — CR DG ABDOMEN ACUTE W/ 1V CHEST
4 series · 4 of 4 positions shown · non-contrast
Comparison: 09/24/2012.

CLINICAL DATA: Constipation with increasing mid abdominal pain.

EXAM:
ACUTE ABDOMEN SERIES (ABDOMEN 2 VIEW & CHEST 1 VIEW)

[w chest pa]
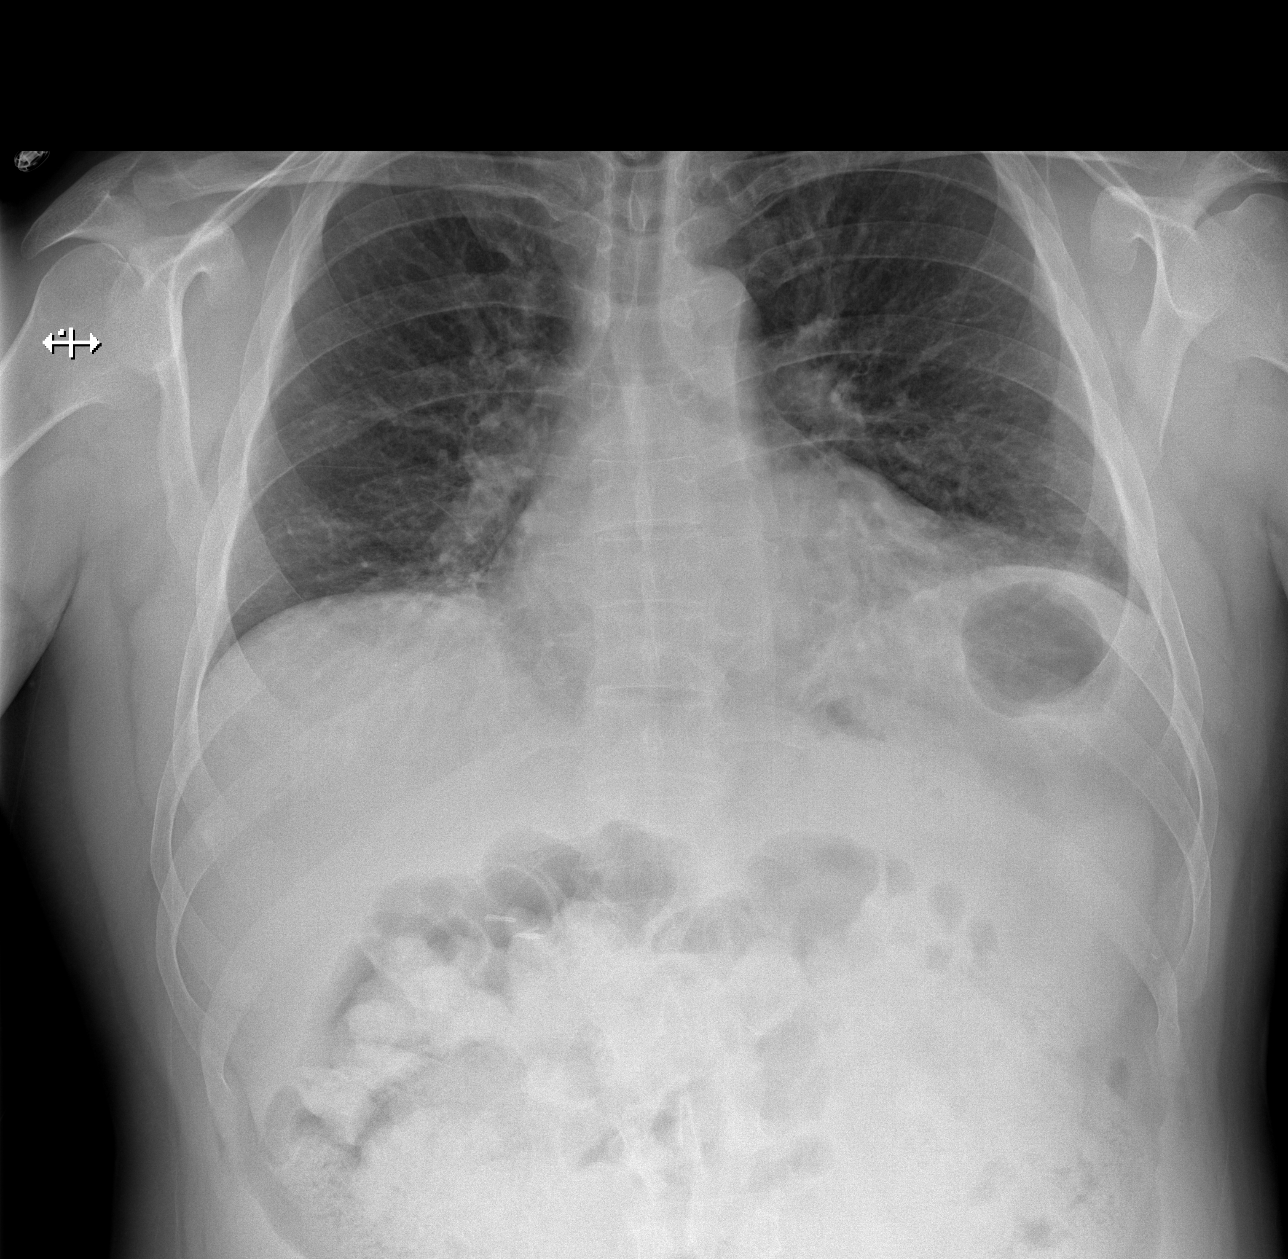

[w abdomen upright]
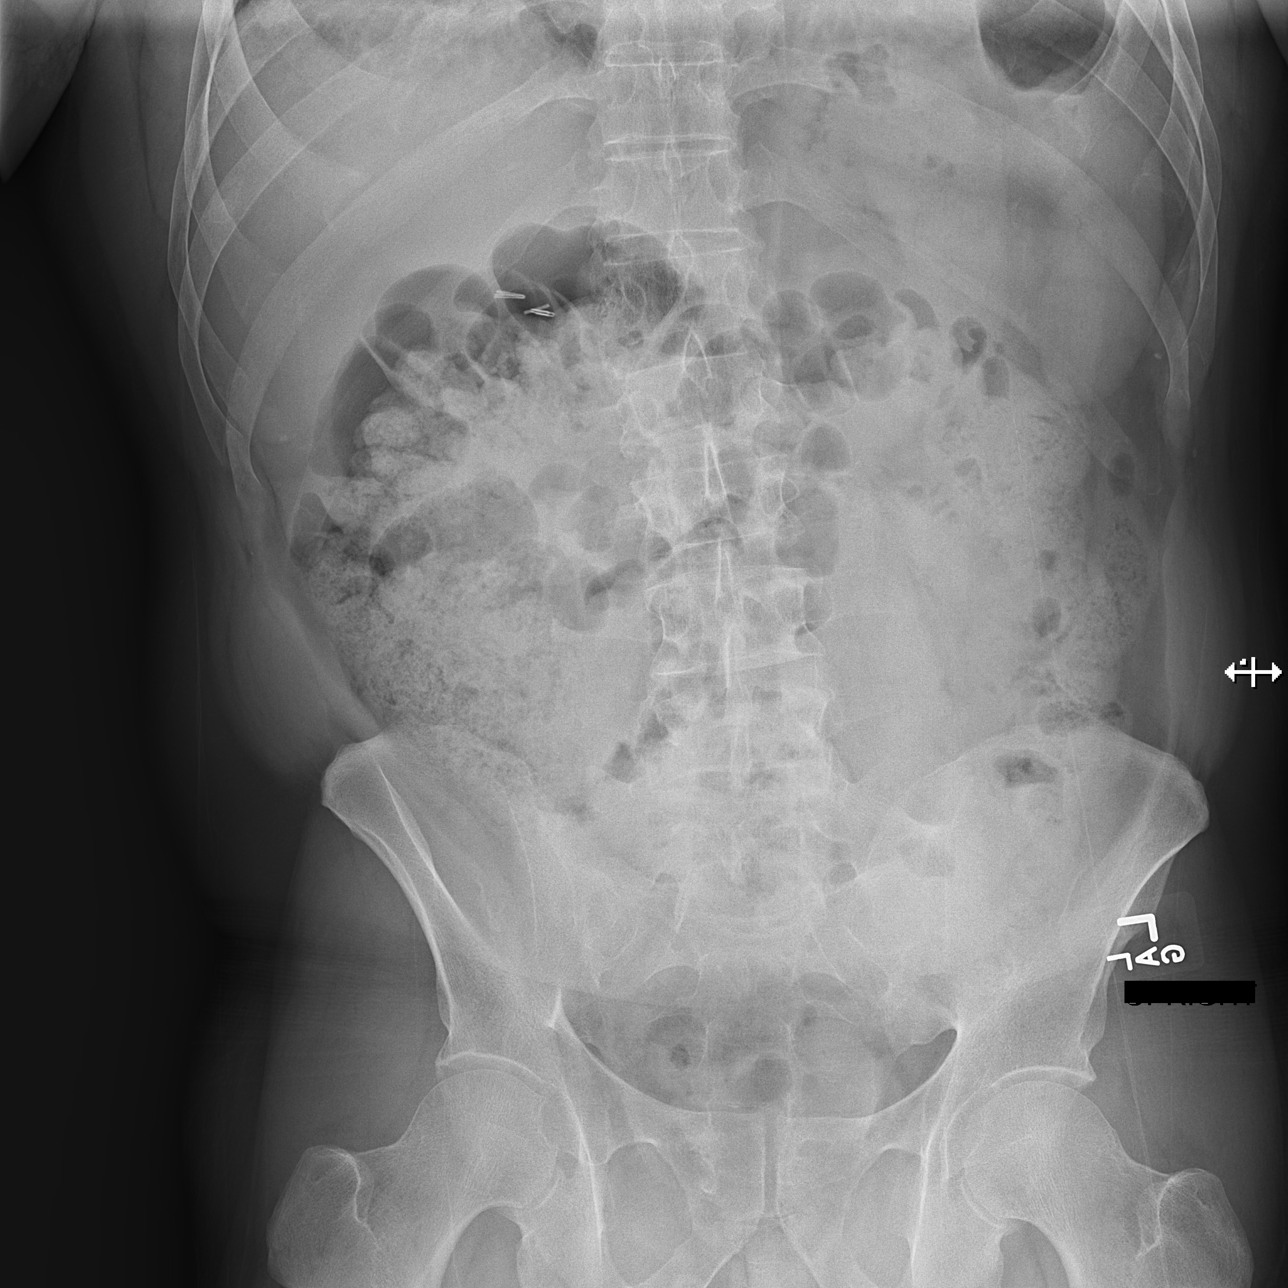

[t abdomen supine (1 of 2)]
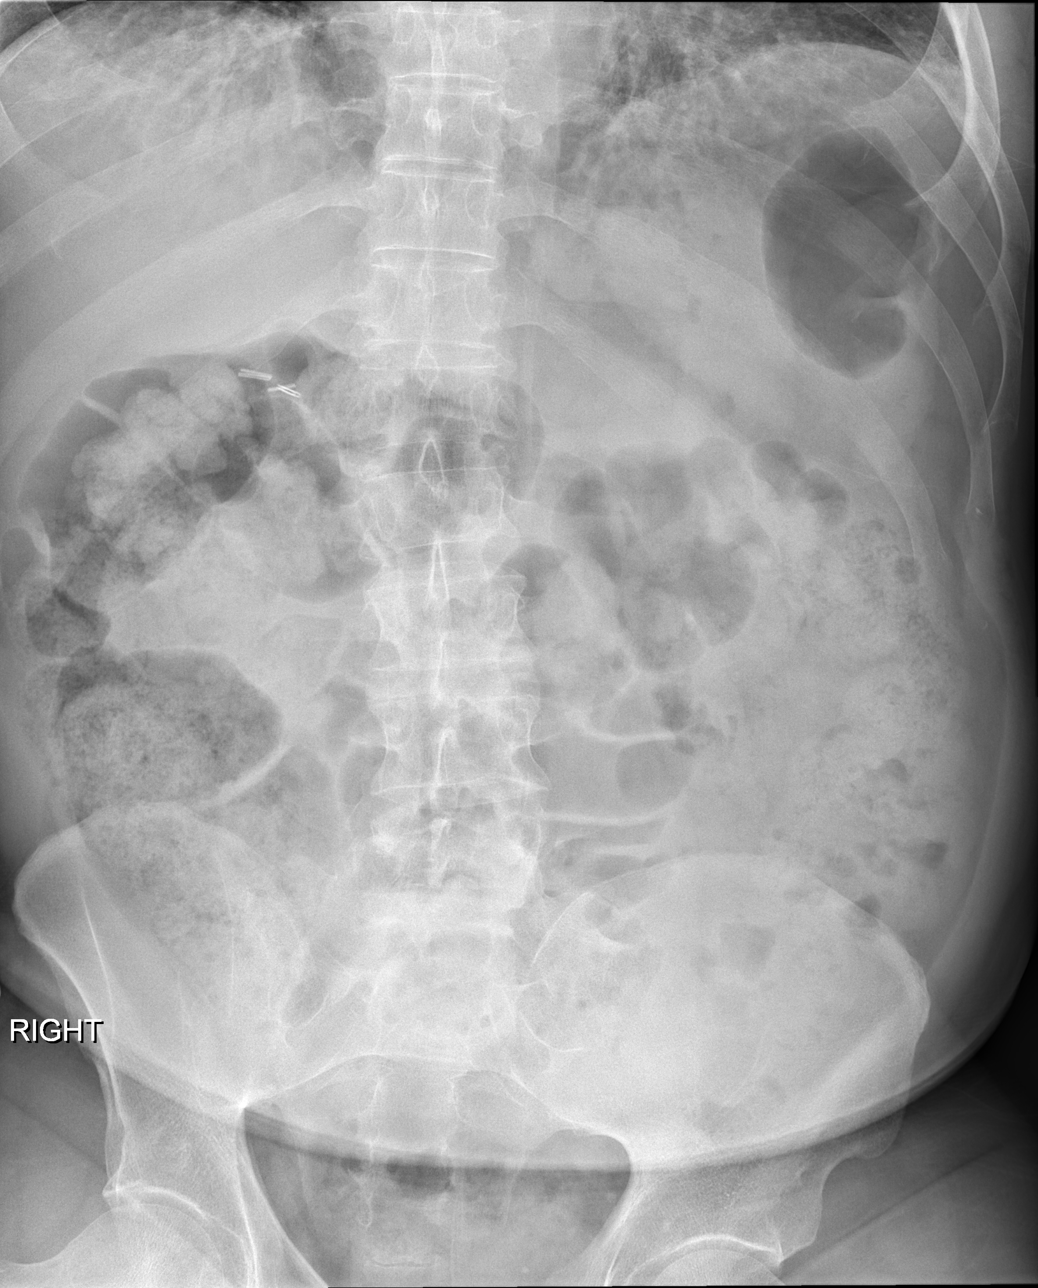

[t abdomen supine (2 of 2)]
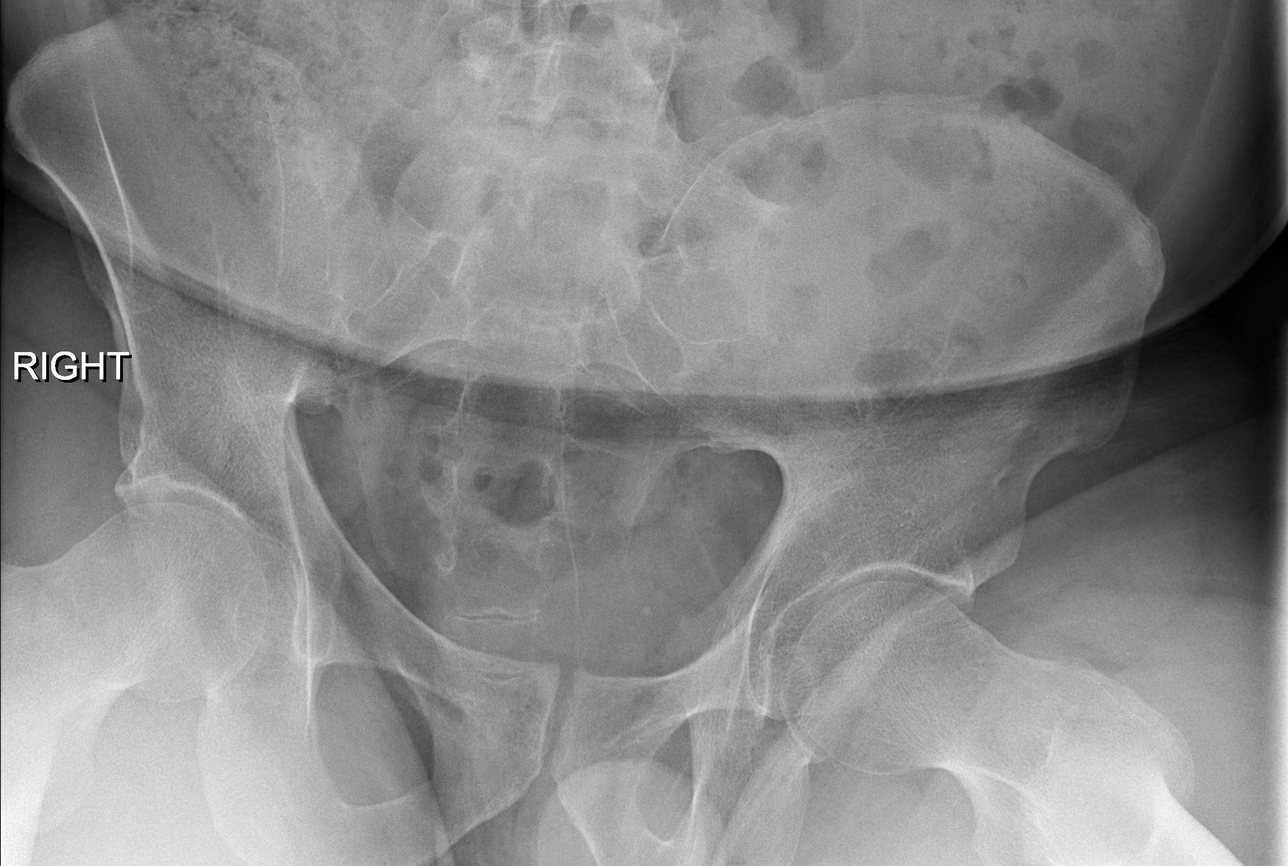

[4 of 4 positions shown; findings below may reference images not displayed]

FINDINGS: There is moderate to large volume of formed stool, compatible with
history of constipation. No dilated small bowel to suggest proximal
obstruction. No pneumoperitoneum. No abnormal intra-abdominal mass
effect or calcification. Cholecystectomy changes. Low volume lungs
with interstitial coarsening at the bases. No cardiomegaly. No acute
osseous findings.
IMPRESSION: 1. Findings consistent with history of constipation. No bowel
obstruction or perforation.
2. Low volume lungs with basilar atelectasis.

## 2015-08-19 ENCOUNTER — Ambulatory Visit (INDEPENDENT_AMBULATORY_CARE_PROVIDER_SITE_OTHER): Payer: Managed Care, Other (non HMO) | Admitting: Emergency Medicine

## 2015-08-19 ENCOUNTER — Encounter: Payer: Self-pay | Admitting: Emergency Medicine

## 2015-08-19 VITALS — BP 116/80 | HR 67 | Temp 97.9°F | Resp 16 | Ht 63.5 in | Wt 170.0 lb

## 2015-08-19 DIAGNOSIS — Z0000 Encounter for general adult medical examination without abnormal findings: Secondary | ICD-10-CM | POA: Diagnosis not present

## 2015-08-19 DIAGNOSIS — E291 Testicular hypofunction: Secondary | ICD-10-CM

## 2015-08-19 DIAGNOSIS — K6289 Other specified diseases of anus and rectum: Secondary | ICD-10-CM

## 2015-08-19 DIAGNOSIS — E785 Hyperlipidemia, unspecified: Secondary | ICD-10-CM

## 2015-08-19 DIAGNOSIS — Z125 Encounter for screening for malignant neoplasm of prostate: Secondary | ICD-10-CM

## 2015-08-19 DIAGNOSIS — L409 Psoriasis, unspecified: Secondary | ICD-10-CM

## 2015-08-19 DIAGNOSIS — Z1159 Encounter for screening for other viral diseases: Secondary | ICD-10-CM

## 2015-08-19 DIAGNOSIS — N200 Calculus of kidney: Secondary | ICD-10-CM | POA: Diagnosis not present

## 2015-08-19 LAB — COMPLETE METABOLIC PANEL WITH GFR
ALBUMIN: 3.9 g/dL (ref 3.6–5.1)
ALK PHOS: 79 U/L (ref 40–115)
ALT: 22 U/L (ref 9–46)
AST: 18 U/L (ref 10–40)
BILIRUBIN TOTAL: 0.6 mg/dL (ref 0.2–1.2)
BUN: 17 mg/dL (ref 7–25)
CALCIUM: 8.9 mg/dL (ref 8.6–10.3)
CHLORIDE: 104 mmol/L (ref 98–110)
CO2: 26 mmol/L (ref 20–31)
CREATININE: 0.9 mg/dL (ref 0.60–1.35)
GFR, Est Non African American: >89 mL/min (ref >=60)
Glucose, Bld: 87 mg/dL (ref 65–99)
Potassium: 4.2 mmol/L (ref 3.5–5.3)
Sodium: 141 mmol/L (ref 135–146)
TOTAL PROTEIN: 6.6 g/dL (ref 6.1–8.1)

## 2015-08-19 LAB — CBC WITH DIFFERENTIAL/PLATELET
BASOS PCT: 0 %
Basophils Absolute: 0 cells/uL (ref 0–200)
EOS ABS: 192 {cells}/uL (ref 15–500)
Eosinophils Relative: 3 %
HEMATOCRIT: 42.8 % (ref 38.5–50.0)
Hemoglobin: 14.1 g/dL (ref 13.2–17.1)
Lymphocytes Relative: 23 %
Lymphs Abs: 1472 cells/uL (ref 850–3900)
MCH: 28.7 pg (ref 27.0–33.0)
MCHC: 32.9 g/dL (ref 32.0–36.0)
MCV: 87 fL (ref 80.0–100.0)
MONO ABS: 448 {cells}/uL (ref 200–950)
MONOS PCT: 7 %
MPV: 9.5 fL (ref 7.5–12.5)
NEUTROS ABS: 4288 {cells}/uL (ref 1500–7800)
Neutrophils Relative %: 67 %
PLATELETS: 267 10*3/uL (ref 140–400)
RBC: 4.92 MIL/uL (ref 4.20–5.80)
RDW: 14.2 % (ref 11.0–15.0)
WBC: 6.4 10*3/uL (ref 3.8–10.8)

## 2015-08-19 LAB — HEPATITIS C ANTIBODY: HCV Ab: NEGATIVE

## 2015-08-19 LAB — POCT URINALYSIS DIP (MANUAL ENTRY)
BILIRUBIN UA: NEGATIVE
BILIRUBIN UA: NEGATIVE
Glucose, UA: NEGATIVE
Leukocytes, UA: NEGATIVE
Nitrite, UA: NEGATIVE
PROTEIN UA: NEGATIVE
Spec Grav, UA: 1.02
Urobilinogen, UA: 0.2
pH, UA: 6

## 2015-08-19 LAB — LIPID PANEL
CHOLESTEROL: 135 mg/dL (ref 125–200)
HDL: 23 mg/dL — ABNORMAL LOW (ref >=40)
LDL Cholesterol: 83 mg/dL (ref <130)
TRIGLYCERIDES: 143 mg/dL (ref <150)
Total CHOL/HDL Ratio: 5.9 Ratio — ABNORMAL HIGH (ref <=5.0)
VLDL: 29 mg/dL (ref <30)

## 2015-08-19 LAB — POC MICROSCOPIC URINALYSIS (UMFC): MUCUS RE: ABSENT

## 2015-08-19 MED ORDER — ICOSAPENT ETHYL 1 G PO CAPS: 2.0000 | ORAL_CAPSULE | Freq: Two times a day (BID) | ORAL | Status: AC

## 2015-08-19 MED ORDER — ROSUVASTATIN CALCIUM 20 MG PO TABS: ORAL_TABLET | ORAL | Status: AC

## 2015-08-19 MED ORDER — CLOBETASOL PROPIONATE 0.05 % EX SOLN: 1.0000 "application " | Freq: Two times a day (BID) | CUTANEOUS | Status: AC

## 2015-08-19 MED ORDER — CALCIPOTRIENE-BETAMETH DIPROP 0.005-0.064 % EX SUSP: Freq: Every day | CUTANEOUS | Status: DC

## 2015-08-19 NOTE — Progress Notes (Signed)
By signing my name below, I, Stann Oresung-Kai Tsai, attest that this documentation has been prepared under the direction and in the presence of Lesle ChrisSteven Carmelle Bamberg, MD. Electronically Signed: Stann Oresung-Kai Tsai, Scribe. 08/19/2015 , 10:16 AM .  Patient was seen in room 22 .  Chief Complaint:  Chief Complaint  Patient presents with  . Annual Exam  . Medication Refill    ALL RXs medications - patient put a DOT beside them    HPI: Jonathan Ballard is a 45 y.o. male who [facility]   He last saw Dr. Everardo AllEllison 1.5 year ago.  He's followed by Dr. Anne FuSkains for cardiology and talked about his diet.  He sees Thedore MinsMojeed Akintayo, MD for neuropsychiatric care.   Exercise He denies regular exercise. He plans that this is the next thing he's going to work on.   Family history His father has history of diabetes and thankfully no early CAD.  His mother has history of breast cancer.   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   Past Medical History  Diagnosis Date  . Hypercholesteremia   . Kidney stone   . Sleep apnea     uses C-PAP  . Low testosterone   . Psoriasis   . Depression    Past Surgical History  Procedure Laterality Date  . Lithotripsy    . Cholecystectomy    . Ureterolithotomy  2008   Social History   Social History  . Marital Status: Married    Spouse Name: N/A  . Number of Children: N/A  . Years of Education: N/A   Occupational History  . 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 Smoking status: Never Smoker   . Smokeless tobacco: None  . Alcohol Use: No  . Drug Use: No  . Sexual Activity: Yes   Other Topics Concern  . None   Social History Narrative   Education: [employer]. Separated.    Family History  Problem Relation Age of Onset  . Diabetes Father   . Bladder Cancer Father   . Hypothyroidism Father   . Hyperlipidemia Father   . Cancer Other   . Hyperlipidemia Other   . Breast cancer Mother   . Hypothyroidism Mother    . Cancer Mother   . ADD / ADHD Son   . Anxiety disorder Son   . Alzheimer's disease Maternal Grandmother   . Cancer Maternal Grandfather   . Stroke Paternal Grandmother   . Diabetes Paternal Grandmother   . Cancer Paternal Grandmother   . Cancer Paternal Grandfather   . Hyperlipidemia Brother    Allergies  Allergen Reactions  . Zithromax [Azithromycin Dihydrate] Other (See Comments)    Reaction=tongue swells  . Niacin And Related Rash  . Niaspan [Niacin Er] Rash  . Penicillins Rash   Prior to Admission medications   Medication Sig Start Date End Date Taking? Authorizing Provider  aspirin EC 81 MG tablet Take 81 mg by mouth daily.    Historical Provider, MD  bisacodyl (DULCOLAX) 5 MG EC tablet Take 5 mg by mouth daily as needed for moderate constipation.    Historical Provider, MD  buPROPion (WELLBUTRIN XL) 150 MG 24 hr tablet Take 450 mg by mouth daily. Reported on 04/01/2015    Historical Provider, MD  Cholecalciferol (VITAMIN D3) 1000 UNITS CAPS Take 5,000 Units by mouth every morning. Reported on 04/01/2015    Historical Provider, MD  citalopram (CELEXA) 20 MG tablet Take 5 mg  by mouth as needed.    Historical Provider, MD  clomiPHENE (CLOMID) 50 MG tablet Take 0.5 tablets (25 mg total) by mouth daily. 05/29/13   Romero Belling, MD  fluocinonide (LIDEX) 0.05 % external solution Apply 1 application topically 2 (two) times daily as needed (itching). Reported on 04/01/2015    Historical Provider, MD  HYDROCORTISONE ACE, RECTAL, 30 MG SUPP ! Per rectum twice a day Patient not taking: Reported on 04/01/2015 01/21/15   Collene Gobble, MD  hyoscyamine (LEVSIN SL) 0.125 MG SL tablet PLACE 1 TABLET (0.125 MG TOTAL) UNDER THE TONGUE EVERY 4 (FOUR) HOURS AS NEEDED. 04/22/14   Romero Belling, MD  Icosapent Ethyl (VASCEPA) 1 G CAPS Take 2 capsules by mouth 2 (two) times daily. 04/01/15   Collene Gobble, MD  ketorolac (TORADOL) 10 MG tablet Take 1 tablet (10 mg total) by mouth every 6 (six) hours as  needed. 06/04/13   Romero Belling, MD  lactulose (CHRONULAC) 10 GM/15ML solution Take 10 g by mouth 2 (two) times daily as needed for moderate constipation.    Historical Provider, MD  naproxen sodium (ANAPROX) 220 MG tablet Take 220 mg by mouth 2 (two) times daily as needed (pain).    Historical Provider, MD  NUVIGIL 200 MG TABS  12/29/13   Historical Provider, MD  omega-3 acid ethyl esters (LOVAZA) 1 G capsule Take 2 g by mouth daily. 09/24/12   Collene Gobble, MD  polyethylene glycol powder (GLYCOLAX/MIRALAX) powder Take 17 g by mouth daily. 05/22/13   Ivonne Andrew, PA-C  Probiotic Product (PROBIOTIC DAILY PO) Take 1 capsule by mouth daily.     Historical Provider, MD  propranolol (INDERAL) 10 MG tablet Take 10 mg by mouth 3 (three) times daily.    Historical Provider, MD  rosuvastatin (CRESTOR) 20 MG tablet TAKE 1 TABLET (20 MG TOTAL) BY MOUTH EVERY MORNING. 04/01/15   Collene Gobble, MD  Salicylic Acid (SALEX) 6 % SHAM Apply 1 application topically daily as needed (itching).     Historical Provider, MD  sildenafil (REVATIO) 20 MG tablet TAKE 1 TABLET (20 MG TOTAL) BY MOUTH DAILY AS NEEDED. 01/19/15   Corky Crafts, MD  tamsulosin (FLOMAX) 0.4 MG CAPS capsule Take 1 capsule (0.4 mg total) by mouth daily. Patient not taking: Reported on 04/01/2015 11/29/14   Raelyn Ensign, PA  triamcinolone (KENALOG) topical spray Apply 1 mg topically 2 (two) times daily as needed (to ears).     Historical Provider, MD  zolpidem (AMBIEN) 10 MG tablet Take 5 mg by mouth at bedtime as needed for sleep.    Historical Provider, MD     ROS:  Constitutional: negative for fever, chills, night sweats, weight changes, or fatigue  HEENT: negative for vision changes, hearing loss, congestion, rhinorrhea, ST, epistaxis, or sinus pressure Cardiovascular: negative for chest pain or palpitations Respiratory: negative for hemoptysis, wheezing, shortness of breath, or cough Abdominal: negative for abdominal pain, nausea,  vomiting, diarrhea, or constipation Dermatological: negative for rash Neurologic: negative for headache, dizziness, or syncope All other systems reviewed and are otherwise negative with the exception to those above and in the HPI.  PHYSICAL EXAM: Filed Vitals:   08/19/15 0938  BP: 116/80  Pulse: 67  Temp: 97.9 F (36.6 C)  Resp: 16   Body mass index is 29.64 kg/(m^2).   General: Alert, no acute distress; Balding male HEENT:  Normocephalic, atraumatic, oropharynx patent. Eye: Nonie Hoyer [facility] Cardiovascular:  Regular rate and rhythm, no rubs murmurs or gallops.  No Carotid bruits, radial pulse intact. No pedal edema.  Respiratory: Clear to auscultation bilaterally.  No wheezes, rales, or rhonchi.  No cyanosis, no use of accessory musculature Abdominal: No organomegaly, abdomen is soft and non-tender, positive bowel sounds. No masses. Musculoskeletal: Gait intact. No edema, tenderness Skin: psoriatic lesions behind right ear, elbows and knees and perianal area Neurologic: Facial musculature symmetric. Psychiatric: Patient acts appropriately throughout our interaction.  Lymphatic: No cervical or submandibular lymphadenopathy Genitourinary/Anorectal: No acute findings; no testicular masses, prostate normal  LABS:   EKG/XRAY:     ASSESSMENT/PLAN: Patient stable.His biggest problem is his elevated cholesterol is on medication for this. He was given a refill on cream for psoriasis. He continues to struggle with his divorce from his wife. He shares custody with the kids.He has been to a counselor for this but continues to have some issues but that her. No change in medications. Will continue to work on diet and I have encouraged him to increase exercise I personally performed the services described in this documentation, which was scribed in my presence. The recorded information has been reviewed and is accurate.  Gross sideeffects, risk and benefits, and alternatives of medications d/w  patient. Patient is aware that all medications have potential sideeffects and we are unable to predict every sideeffect or drug-drug interaction that may occur.  Lesle Chris MD 08/19/2015 10:22 AM

## 2015-08-20 LAB — PSA: PSA: 0.57 ng/mL (ref <=4.00)

## 2015-08-24 LAB — TESTOS,TOTAL,FREE AND SHBG (FEMALE)
SEX HORMONE BINDING GLOB.: 24 nmol/L (ref 10–50)
TESTOSTERONE,FREE: 105 pg/mL (ref 35.0–155.0)
TESTOSTERONE,TOTAL,LC/MS/MS: 484 ng/dL (ref 250–1100)

## 2015-08-27 ENCOUNTER — Encounter: Payer: Self-pay | Admitting: *Deleted

## 2015-08-30 ENCOUNTER — Telehealth: Payer: Self-pay

## 2015-08-30 ENCOUNTER — Encounter: Payer: Self-pay | Admitting: Emergency Medicine

## 2015-08-30 NOTE — Telephone Encounter (Signed)
Called patient in regards to his portable CPAP machine. Left a message for him to call back.

## 2015-08-30 NOTE — Telephone Encounter (Signed)
Ref number for [employer] claim is [id number]. Fax with letter attached was successfully sent to [employer] today 08/30/2015.

## 2015-09-02 ENCOUNTER — Telehealth: Payer: Self-pay

## 2015-09-02 DIAGNOSIS — G4730 Sleep apnea, unspecified: Secondary | ICD-10-CM

## 2015-09-02 NOTE — Telephone Encounter (Signed)
Left pt a vm because he left a vm on the lab phone saying he saw his lab results online and he was suppose to get a prescription, just need to know what prescription is he referring to

## 2015-09-07 ENCOUNTER — Encounter: Payer: Self-pay | Admitting: Emergency Medicine

## 2015-09-09 ENCOUNTER — Other Ambulatory Visit: Payer: Self-pay | Admitting: Emergency Medicine

## 2015-09-09 DIAGNOSIS — N5201 Erectile dysfunction due to arterial insufficiency: Secondary | ICD-10-CM

## 2015-09-09 MED ORDER — CLOMIPHENE CITRATE 50 MG PO TABS: 25.0000 mg | ORAL_TABLET | Freq: Every day | ORAL | Status: DC

## 2015-09-10 NOTE — Telephone Encounter (Signed)
Dr Cleta Albertsaub, Delaney Meigsamara had asked me to work on this CPAP order in her absence today, but I do not see any notes about OSA, and it is not on his problem list. Also, I don't see any sleep studies scanned into his chart. Do you what pt's Dx is and if sleep study was done? If you have discussed sleep apnea w/pt at last OV but not in notes, you may be able to addend them. I started to pend the order for CPAP, but the order was asking these questions I did not know. I wanted to ask you first to see if you have eval/ documented OSA before calling pt because if not, pt will probably need to come in first. I'm not sure what is needed for documentation if he is not medicare, maybe they don't need any notes for order.

## 2015-09-15 NOTE — Telephone Encounter (Signed)
Can we sign the order for th CPAP

## 2015-09-15 NOTE — Addendum Note (Signed)
Addended by: Cydney OkAUGUSTIN, Jalayah Gutridge N on: 09/15/2015 10:00 AM   Modules accepted: Orders

## 2015-09-15 NOTE — Telephone Encounter (Signed)
I do not know the pressure reading he uses. Just call and ask him and he will give us what pressure he uses on his home machine.

## 2015-09-15 NOTE — Telephone Encounter (Signed)
I received a fax today that we sent the order to [facility].

## 2015-09-15 NOTE — Telephone Encounter (Signed)
What pressure does he need? Then we can fax.

## 2015-09-16 ENCOUNTER — Encounter: Payer: Self-pay | Admitting: Emergency Medicine

## 2015-09-16 NOTE — Telephone Encounter (Signed)
Sent email asking for settings, then Dr. Cleta Albertsaub can sign.

## 2015-09-16 NOTE — Telephone Encounter (Signed)
Sign this order Dr. Cleta Albertsaub.

## 2015-09-20 ENCOUNTER — Other Ambulatory Visit: Payer: Self-pay | Admitting: Emergency Medicine

## 2015-09-20 DIAGNOSIS — N5201 Erectile dysfunction due to arterial insufficiency: Secondary | ICD-10-CM

## 2015-09-20 MED ORDER — CLOMIPHENE CITRATE 50 MG PO TABS: 25.0000 mg | ORAL_TABLET | Freq: Every day | ORAL | Status: AC

## 2015-11-30 ENCOUNTER — Encounter: Payer: Self-pay | Admitting: Emergency Medicine

## 2016-12-15 ENCOUNTER — Other Ambulatory Visit: Payer: Self-pay | Admitting: Urology

## 2016-12-15 ENCOUNTER — Encounter (HOSPITAL_COMMUNITY): Payer: Self-pay | Admitting: *Deleted

## 2016-12-19 NOTE — Patient Instructions (Signed)
Jonathan Ballard  12/19/2016   Your procedure is scheduled on: 12/29/2016    [facility]  Entrance [place]  elevators to 3rd floor to  Short Stay Center at    200pm.     Call this number if you have problems the morning of surgery [phone]    Remember: ONLY 1 PERSON MAY GO WITH YOU TO SHORT STAY TO GET  READY MORNING OF YOUR SURGERY.  Do not eat food after midnite,  May have clear liquids from 12 midnite until 100am morning of surgery then nothing by mouth.      Take these medicines the morning of surgery with A SIP OF WATER: Celexa                                 You may not have any metal on your body including hair pins and              piercings  Do not wear jewelry,  lotions, powders or perfumes, deodorant                          Men may shave face and neck.   Do not bring valuables to the hospital. [facility] IS NOT             RESPONSIBLE   FOR VALUABLES.  Contacts, dentures or bridgework may not be worn into surgery.      Patients discharged the day of surgery will not be allowed to drive home.  Name and phone number of your driver:                Please read over the following fact sheets you were given: _____________________________________________________________________             [facility] - Preparing for Surgery Before surgery, you can play an important role.  Because skin is not sterile, your skin needs to be as free of germs as possible.  You can reduce the number of germs on your skin by washing with CHG (chlorahexidine gluconate) soap before surgery.  CHG is an antiseptic cleaner which kills germs and bonds with the skin to continue killing germs even after washing. Please DO NOT use if you have an allergy to CHG or antibacterial soaps.  If your skin becomes reddened/irritated stop using the CHG and inform your nurse when you arrive at Short Stay. Do not shave (including legs and underarms) for at least 48  hours prior to the first CHG shower.  You may shave your face/neck. Please follow these instructions carefully:  1.  Shower with CHG Soap the night before surgery and the  morning of Surgery.  2.  If you choose to wash your hair, wash your hair first as usual with your  normal  shampoo.  3.  After you shampoo, rinse your hair and body thoroughly to remove the  shampoo.                           4.  Use CHG as you would any other liquid soap.  You can apply chg directly  to the skin and wash  Gently with a scrungie or clean washcloth.  5.  Apply the CHG Soap to your body ONLY FROM THE NECK DOWN.   Do not use on face/ open                           Wound or open sores. Avoid contact with eyes, ears mouth and genitals (private parts).                       Wash face,  Genitals (private parts) with your normal soap.             6.  Wash thoroughly, paying special attention to the area where your surgery  will be performed.  7.  Thoroughly rinse your body with warm water from the neck down.  8.  DO NOT shower/wash with your normal soap after using and rinsing off  the CHG Soap.                9.  Pat yourself dry with a clean towel.            10.  Wear clean pajamas.            11.  Place clean sheets on your bed the night of your first shower and do not  sleep with pets. Day of Surgery : Do not apply any lotions/deodorants the morning of surgery.  Please wear clean clothes to the hospital/surgery center.  FAILURE TO FOLLOW THESE INSTRUCTIONS MAY RESULT IN THE CANCELLATION OF YOUR SURGERY PATIENT SIGNATURE_________________________________  NURSE SIGNATURE__________________________________  ________________________________________________________________________    CLEAR LIQUID DIET   Foods Allowed                                                                     Foods Excluded  Coffee and tea, regular and decaf                             liquids that you cannot   Plain Jell-O in any flavor                                             see through such as: Fruit ices (not with fruit pulp)                                     milk, soups, orange juice  Iced Popsicles                                    All solid food Carbonated beverages, regular and diet                                    Cranberry, grape and apple juices Sports drinks like Gatorade Lightly seasoned clear broth or consume(fat free) Sugar, honey syrup  Sample Menu Breakfast                                Lunch                                     Supper Cranberry juice                    Beef broth                            Chicken broth Jell-O                                     Grape juice                           Apple juice Coffee or tea                        Jell-O                                      Popsicle                                                Coffee or tea                        Coffee or tea  _____________________________________________________________________

## 2016-12-21 ENCOUNTER — Encounter (HOSPITAL_COMMUNITY)
Admission: RE | Admit: 2016-12-21 | Discharge: 2016-12-21 | Disposition: A | Discharge status: Home / Self Care | Payer: Managed Care, Other (non HMO) | Source: Ambulatory Visit | Attending: Urology | Admitting: Urology

## 2016-12-29 ENCOUNTER — Ambulatory Visit: Admit: 2016-12-29 | Payer: Managed Care, Other (non HMO) | Admitting: Urology

## 2016-12-29 SURGERY — CYSTOURETEROSCOPY, WITH RETROGRADE PYELOGRAM AND STENT INSERTION
Anesthesia: General | Laterality: Left

## 2020-03-02 ENCOUNTER — Ambulatory Visit: Payer: Self-pay | Admitting: Surgery

## 2020-03-02 NOTE — H&P (Signed)
CC: Umbilical bulge; left gluteal cyst  HPI: Jonathan Ballard is a very pleasant 48yoM he was referred to our office for evaluation of 2 things. He has noticed a bulge at his umbilical area for the last 8-12 months. 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 He also notes some discomfort at the hernia site itself and becoming more noticeable/sore over the last couple of months. He denies any history of this getting stuck. He does have a history of laparoscopic cholecystectomy years ago by Dr. Rosenbower. He notes that over the last 8 months or so he's developed a cystic-like lesion on the left lateral portion of the left buttock. He denies any history of redness/drainage at this location. It is uncomfortable as it is in an area where he sits and he will sometimes feel a nerve/pinching type pain.  PMH: HTN, HLD  PSH: Laparoscopic cholecystectomy  FHx: Denies FHx of colorectal, breast, endometrial, ovarian or cervical cancer  Social: Denies use of tobacco/EtOH/drugs. He works at [employer] is a retail pharmacist  ROS: A comprehensive 10 system review of systems was completed with the patient and pertinent findings as noted above.  The patient is a 48 year old male.   Past Surgical History (Chanel Nolan, CMA; 02/24/2020 11:27 AM) Colon Polyp Removal - Colonoscopy  Gallbladder Surgery - Laparoscopic   Diagnostic Studies History (Chanel Nolan, CMA; 02/24/2020 11:27 AM) Colonoscopy  within last year  Allergies (Chanel Nolan, CMA; 02/24/2020 11:29 AM) Azithromycin *CHEMICALS*  Penicillins  Niacin and Related  Niaspan *ANTIHYPERLIPIDEMICS*  Allergies Reconciled   Medication History (Chanel Nolan, CMA; 02/24/2020 11:30 AM) amLODIPine Besylate (5MG Tablet, Oral) Active. Citalopram Hydrobromide (20MG Tablet, Oral) Active. clomiPHENE Citrate (50MG Tablet, Oral) Active. Stelara  (45MG/0.5ML Soln Pref Syr, Subcutaneous) Active. Vascepa (1GM Capsule, Oral) Active. Aspirin (81MG Tablet, Oral) Active. Clobetasol Propionate (External) Specific strength unknown - Active. Crestor (20MG Tablet, Oral) Active. Fluocinolone Acetonide (0.01% Cream, External) Active. Folic Acid (800MCG Tablet, Oral) Active. Nuvigil (250MG Tablet, Oral) Active. Medications Reconciled  Social History (Chanel Nolan, CMA; 02/24/2020 11:27 AM) Alcohol use  Occasional alcohol use. No caffeine use  No drug use  Tobacco use  Never smoker.  Family History (Chanel Nolan, CMA; 02/24/2020 11:27 AM) Breast Cancer  Mother. Cancer  Father. Depression  Son. Diabetes Mellitus  Father. Hypertension  Mother. Thyroid problems  Father, Mother.  Other Problems (Chanel Nolan, CMA; 02/24/2020 11:27 AM) Depression  Hemorrhoids  High blood pressure  Hypercholesterolemia  Kidney Stone  Sleep Apnea  Umbilical Hernia Repair     Review of Systems (Cailey Trigueros M. Arina Torry MD; 02/24/2020 11:52 AM) General Not Present- Appetite Loss, Chills, Fatigue, Fever, Night Sweats, Weight Gain and Weight Loss. HEENT Not Present- Earache, Hearing Loss, Hoarseness, Nose Bleed, Oral Ulcers, Ringing in the Ears, Seasonal Allergies, Sinus Pain, Sore Throat, Visual Disturbances, Wears glasses/contact lenses and Yellow Eyes. Respiratory Not Present- Bloody sputum, Chronic Cough, Difficulty Breathing, Snoring and Wheezing. Breast Not Present- Breast Mass, Breast Pain, Nipple Discharge and Skin Changes. Cardiovascular Not Present- Chest Pain, Difficulty Breathing Lying Down, Leg Cramps, Palpitations, Rapid Heart Rate, Shortness of Breath and Swelling of Extremities. Gastrointestinal Present- Abdominal Pain. Not Present- Nausea and Vomiting. Neurological Not Present- Decreased Memory and Difficulty Speaking. Psychiatric Not Present- Anxiety and Depression. Endocrine Not Present- Cold Intolerance, Excessive  Hunger, Hair Changes, Heat Intolerance, Hot flashes and New Diabetes. Hematology Not Present- Blood Thinners, Easy Bruising, Excessive bleeding, Gland   problems, HIV and Persistent Infections.  Vitals (Chanel Nolan CMA; 02/24/2020 11:31 AM) 02/24/2020 11:31 AM Weight: 167.25 lb Height: 64in Body Surface Area: 1.81 m Body Mass Index: 28.71 kg/m  Temp.: 98.55F  Pulse: 92 (Regular)  BP: 130/72(Sitting, Left Arm, Standard)       Physical Exam Cristal Deer M. Katalina Magri MD; 02/24/2020 11:53 AM) The physical exam findings are as follows: Note: Constitutional: No acute distress; conversant; no deformities; wearing mask Eyes: Moist conjunctiva; no lid lag; anicteric sclerae; pupils equal round and round Neck: Trachea midline; no palpable thyromegaly Lungs: Normal respiratory effort; no tactile fremitus CV: rrr; no palpable thrill; no pitting edema GI: Abdomen soft, nontender, nondistended; no palpable hepatosplenomegaly. + Diastasis recti. Small reducible umbilical bulge consistent with hernia. No skin changes Skin: Left lateral buttock with ~2 x 2 cm cystic lesion in subQ tissue - no skin changes; firmer than would be expected with lipoma. MSK: Normal gait; no clubbing/cyanosis Psychiatric: Appropriate affect; alert and oriented 3 Lymphatic: No palpable cervical or axillary lymphadenopathy **A chaperone, [employer], was present for this encounter    Assessment & Plan Cristal Deer M. Sumayah Bearse MD; 02/24/2020 11:59 AM) UMBILICAL HERNIA (K42.9) Story: Mr. Bonura is a very pleasant 48yoM with hx of HTN, HLD here for evaluation of 2 things - 1st being a symptomatic small/reducible umbilical hernia Impression: -The anatomy and physiology of the GI tract and abdominal wall was discussed at length with the patient. The pathophysiology of hernias was discussed at length as well. Given symptoms related to this, we discussed proceeding with repair. We spent time reviewing open umbilical  hernia repair with possible placement of mesh-based upon intraoperative size. We discussed if this is less than < 2 cm, proceeding with primary (tissue) repair without mesh. -The planned procedure, material risks (including, but not limited to, pain, bleeding, infection, scarring, need for blood transfusion, damage to surrounding structures-blood vessels/nerves/viscus/organs, need for additional procedures, recurrence, chronic pain, mesh complications including erosion into other structures/vessels/organs/viscus, pneumonia, heart attack, stroke, death) benefits and alternatives to surgery were discussed at length. I noted a good probability that the procedure help improve his symptoms and the bulge. The patient's questions were answered to his satisfaction, he voiced understanding and they elected to proceed with surgery. Additionally, we discussed typical postoperative expectations and the recovery process.  This patient encounter took 32 minutes today to perform the following: take history, perform exam, review outside records, interpret imaging, counsel the patient on their diagnosis and document encounter, findings & plan in the EHR Current Plans Pt Education - CCS Mesh education: discussed with patient and provided information. INCLUSION CYST (L72.0) Impression: -Symptomatic cystic lesion in left lateral buttock ~2x2 cm in size; we discussed options including removal given symptoms. -We reviewed excision of cystic lesion. The planned procedure, material risks (including, but not limited to, pain, bleeding, infection, seroma, hematoma, recurrence, need for additional procedures) benefits and alternatives were reviewed. Questions were answered to his satisfaction, he voiced understanding and elected to proceed -Will work to do these concurrently - proceeding with repair of the hernia first before re-positioning into the right lateral decubitus for removal of this lesion  Signed by Andria Meuse,  MD (02/24/2020 11:59 AM)

## 2020-03-02 NOTE — H&P (View-Only) (Signed)
CC: Umbilical bulge; left gluteal cyst  HPI: Mr. Dasaro is a very pleasant 49yoM he was referred to our office for evaluation of 2 things. He has noticed a bulge at his umbilical area for the last 8-12 months. 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 He also notes some discomfort at the hernia site itself and becoming more noticeable/sore over the last couple of months. He denies any history of this getting stuck. He does have a history of laparoscopic cholecystectomy years ago by Dr. Abbey Chatters. He notes that over the last 8 months or so he's developed a cystic-like lesion on the left lateral portion of the left buttock. He denies any history of redness/drainage at this location. It is uncomfortable as it is in an area where he sits and he will sometimes feel a nerve/pinching type pain.  PMH: HTN, HLD  PSH: Laparoscopic cholecystectomy  FHx: Denies FHx of colorectal, breast, endometrial, ovarian or cervical cancer  Social: Denies use of tobacco/EtOH/drugs. 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  The patient is a 49 year old male.   Past Surgical History [facility] Lonni Fix, CMA; 02/24/2020 11:27 AM) Colon Polyp Removal - Colonoscopy  Gallbladder Surgery - Laparoscopic   Diagnostic Studies History (Chanel Lonni Fix, CMA; 02/24/2020 11:27 AM) Colonoscopy  within last year  Allergies (Chanel Lonni Fix, CMA; 02/24/2020 11:29 AM) Azithromycin *CHEMICALS*  Penicillins  Niacin and Related  Niaspan *ANTIHYPERLIPIDEMICS*  Allergies Reconciled   Medication History (Chanel Lonni Fix, CMA; 02/24/2020 11:30 AM) amLODIPine Besylate (5MG  Tablet, Oral) Active. Citalopram Hydrobromide (20MG  Tablet, Oral) Active. clomiPHENE Citrate (50MG  Tablet, Oral) Active. Stelara  (45MG /0.5ML Soln Pref Syr, Subcutaneous) Active. Vascepa (1GM Capsule, Oral) Active. Aspirin (81MG  Tablet, Oral) Active. Clobetasol Propionate (External) Specific strength unknown - Active. Crestor (20MG  Tablet, Oral) Active. Fluocinolone Acetonide (0.01% Cream, External) Active. Folic Acid ( Tablet, Oral) Active. Nuvigil (250MG  Tablet, Oral) Active. Medications Reconciled  Social History , CMA; 02/24/2020 11:27 AM) Alcohol use  Occasional alcohol use. No caffeine use  No drug use  Tobacco use  Never smoker.  Family History , CMA; 02/24/2020 11:27 AM) Breast Cancer  Mother. Cancer  Father. Depression  Son. Diabetes Mellitus  Father. Hypertension  Mother. Thyroid problems  Father, Mother.  Other Problems , CMA; 02/24/2020 11:27 AM) Depression  Hemorrhoids  High blood pressure  Hypercholesterolemia  Kidney Stone  Sleep Apnea  Umbilical Hernia Repair     Review of Systems M. Yue Glasheen MD; 02/24/2020 11:52 AM) General Not Present- Appetite Loss, Chills, Fatigue, Fever, Night Sweats, Weight Gain and Weight Loss. HEENT Not Present- Earache, Hearing Loss, Hoarseness, Nose Bleed, Oral Ulcers, Ringing in the Ears, Seasonal Allergies, Sinus Pain, Sore Throat, Visual Disturbances, Wears glasses/contact lenses and Yellow Eyes. Respiratory Not Present- Bloody sputum, Chronic Cough, Difficulty Breathing, Snoring and Wheezing. Breast Not Present- Breast Mass, Breast Pain, Nipple Discharge and Skin Changes. Cardiovascular Not Present- Chest Pain, Difficulty Breathing Lying Down, Leg Cramps, Palpitations, Rapid Heart Rate, Shortness of Breath and Swelling of Extremities. Gastrointestinal Present- Abdominal Pain. Not Present- Nausea and Vomiting. Neurological Not Present- Decreased Memory and Difficulty Speaking. Psychiatric Not Present- Anxiety and Depression. Endocrine Not Present- Cold Intolerance, Excessive  Hunger, Hair Changes, Heat Intolerance, Hot flashes and New Diabetes. Hematology Not Present- Blood Thinners, Easy Bruising, Excessive bleeding, Gland  problems, HIV and Persistent Infections.  Vitals (Chanel Nolan CMA; 02/24/2020 11:31 AM) 02/24/2020 11:31 AM Weight: 167.25 lb Height: 64in Body Surface Area: 1.81 m Body Mass Index: 28.71 kg/m  Temp.: 98.55F  Pulse: 92 (Regular)  BP: 130/72(Sitting, Left Arm, Standard)       Physical Exam Cristal Deer M. Melrose Kearse MD; 02/24/2020 11:53 AM) The physical exam findings are as follows: Note: Constitutional: No acute distress; conversant; no deformities; wearing mask Eyes: Moist conjunctiva; no lid lag; anicteric sclerae; pupils equal round and round Neck: Trachea midline; no palpable thyromegaly Lungs: Normal respiratory effort; no tactile fremitus CV: rrr; no palpable thrill; no pitting edema GI: Abdomen soft, nontender, nondistended; no palpable hepatosplenomegaly. + Diastasis recti. Small reducible umbilical bulge consistent with hernia. No skin changes Skin: Left lateral buttock with ~2 x 2 cm cystic lesion in subQ tissue - no skin changes; firmer than would be expected with lipoma. MSK: Normal gait; no clubbing/cyanosis Psychiatric: Appropriate affect; alert and oriented 3 Lymphatic: No palpable cervical or axillary lymphadenopathy **A chaperone, [employer], was present for this encounter    Assessment & Plan Cristal Deer M. Saundra Gin MD; 02/24/2020 11:59 AM) UMBILICAL HERNIA (K42.9) Story: Mr. Bonura is a very pleasant 49yoM with hx of HTN, HLD here for evaluation of 2 things - 1st being a symptomatic small/reducible umbilical hernia Impression: -The anatomy and physiology of the GI tract and abdominal wall was discussed at length with the patient. The pathophysiology of hernias was discussed at length as well. Given symptoms related to this, we discussed proceeding with repair. We spent time reviewing open umbilical  hernia repair with possible placement of mesh-based upon intraoperative size. We discussed if this is less than < 2 cm, proceeding with primary (tissue) repair without mesh. -The planned procedure, material risks (including, but not limited to, pain, bleeding, infection, scarring, need for blood transfusion, damage to surrounding structures-blood vessels/nerves/viscus/organs, need for additional procedures, recurrence, chronic pain, mesh complications including erosion into other structures/vessels/organs/viscus, pneumonia, heart attack, stroke, death) benefits and alternatives to surgery were discussed at length. I noted a good probability that the procedure help improve his symptoms and the bulge. The patient's questions were answered to his satisfaction, he voiced understanding and they elected to proceed with surgery. Additionally, we discussed typical postoperative expectations and the recovery process.  This patient encounter took 32 minutes today to perform the following: take history, perform exam, review outside records, interpret imaging, counsel the patient on their diagnosis and document encounter, findings & plan in the EHR Current Plans Pt Education - CCS Mesh education: discussed with patient and provided information. INCLUSION CYST (L72.0) Impression: -Symptomatic cystic lesion in left lateral buttock ~2x2 cm in size; we discussed options including removal given symptoms. -We reviewed excision of cystic lesion. The planned procedure, material risks (including, but not limited to, pain, bleeding, infection, seroma, hematoma, recurrence, need for additional procedures) benefits and alternatives were reviewed. Questions were answered to his satisfaction, he voiced understanding and elected to proceed -Will work to do these concurrently - proceeding with repair of the hernia first before re-positioning into the right lateral decubitus for removal of this lesion  Signed by Andria Meuse,  MD (02/24/2020 11:59 AM)

## 2020-03-02 NOTE — Progress Notes (Signed)
DUE TO COVID-19 ONLY ONE VISITOR IS ALLOWED TO COME WITH YOU AND STAY IN THE WAITING ROOM ONLY DURING PRE OP AND PROCEDURE DAY OF SURGERY. THE 1 VISITOR  MAY VISIT WITH YOU AFTER SURGERY IN YOUR PRIVATE ROOM DURING VISITING HOURS ONLY!  YOU NEED TO HAVE A COVID 19 TEST ON__11/27/2021 _____ @_______ , THIS TEST MUST BE DONE BEFORE SURGERY,  COVID TESTING SITE [address] [place] , IT IS ON THE RIGHT GOING OUT [address] APPROXIMATELY  2 MINUTES PAST [employer] ON THE RIGHT. ONCE YOUR COVID TEST IS COMPLETED,  PLEASE BEGIN THE QUARANTINE INSTRUCTIONS AS OUTLINED IN YOUR HANDOUT.                Jonathan Ballard  03/02/2020   Your procedure is scheduled on: 03/17/2020    Report to [facility] Main  Entrance   Report to admitting at    0630 AM     Call this number if you have problems the morning of surgery [phone]    Remember: Do not eat food , candy gum or mints :After Midnight. You may have clear liquids from midnight until  0530am   CLEAR LIQUID DIET   Foods Allowed                                                                       Coffee and tea, regular and decaf                              Plain Jell-O any favor except red or purple                                            Fruit ices (not with fruit pulp)                                      Iced Popsicles                                     Carbonated beverages, regular and diet                                    Cranberry, grape and apple juices Sports drinks like Gatorade Lightly seasoned clear broth or consume(fat free) Sugar, honey syrup   _____________________________________________________________________    BRUSH YOUR TEETH MORNING OF SURGERY AND RINSE YOUR MOUTH OUT, NO CHEWING GUM CANDY OR MINTS.     Take these medicines the morning of surgery with A SIP OF WATER:   DO NOT TAKE ANY DIABETIC MEDICATIONS DAY OF YOUR SURGERY                                You may not have any metal on your body including  hair pins and              piercings  Do not wear jewelry, make-up, lotions, powders or perfumes, deodorant             Do not wear nail polish on your fingernails.  Do not shave  48 hours prior to surgery.              Men may shave face and neck.   Do not bring valuables to the hospital. [place].  Contacts, dentures or bridgework may not be worn into surgery.  Leave suitcase in the car. After surgery it may be brought to your room.     Patients discharged the day of surgery will not be allowed to drive home. IF YOU ARE HAVING SURGERY AND GOING HOME THE SAME DAY, YOU MUST HAVE AN ADULT TO DRIVE YOU HOME AND BE WITH YOU FOR 24 HOURS. YOU MAY GO HOME BY TAXI OR UBER OR ORTHERWISE, BUT AN ADULT MUST ACCOMPANY YOU HOME AND STAY WITH YOU FOR 24 HOURS.  Name and phone number of your driver:  Special Instructions: N/A              Please read over the following fact sheets you were given: _____________________________________________________________________  [facility] - Preparing for Surgery Before surgery, you can play an important role.  Because skin is not sterile, your skin needs to be as free of germs as possible.  You can reduce the number of germs on your skin by washing with CHG (chlorahexidine gluconate) soap before surgery.  CHG is an antiseptic cleaner which kills germs and bonds with the skin to continue killing germs even after washing. Please DO NOT use if you have an allergy to CHG or antibacterial soaps.  If your skin becomes reddened/irritated stop using the CHG and inform your nurse when you arrive at Short Stay. Do not shave (including legs and underarms) for at least 48 hours prior to the first CHG shower.  You may shave your face/neck. Please follow these instructions carefully:  1.  Shower with CHG Soap the night before surgery and the  morning of Surgery.  2.  If you choose  to wash your hair, wash your hair first as usual with your  normal  shampoo.  3.  After you shampoo, rinse your hair and body thoroughly to remove the  shampoo.                           4.  Use CHG as you would any other liquid soap.  You can apply chg directly  to the skin and wash                       Gently with a scrungie or clean washcloth.  5.  Apply the CHG Soap to your body ONLY FROM THE NECK DOWN.   Do not use on face/ open                           Wound or open sores. Avoid contact with eyes, ears mouth and genitals (private parts).                       Wash face,  Genitals (private parts) with  your normal soap.             6.  Wash thoroughly, paying special attention to the area where your surgery  will be performed.  7.  Thoroughly rinse your body with warm water from the neck down.  8.  DO NOT shower/wash with your normal soap after using and rinsing off  the CHG Soap.                9.  Pat yourself dry with a clean towel.            10.  Wear clean pajamas.            11.  Place clean sheets on your bed the night of your first shower and do not  sleep with pets. Day of Surgery : Do not apply any lotions/deodorants the morning of surgery.  Please wear clean clothes to the hospital/surgery center.  FAILURE TO FOLLOW THESE INSTRUCTIONS MAY RESULT IN THE CANCELLATION OF YOUR SURGERY PATIENT SIGNATURE_________________________________  NURSE SIGNATURE__________________________________  ________________________________________________________________________

## 2020-03-04 ENCOUNTER — Other Ambulatory Visit: Payer: Self-pay

## 2020-03-04 ENCOUNTER — Encounter (HOSPITAL_COMMUNITY): Payer: Self-pay

## 2020-03-04 ENCOUNTER — Encounter (HOSPITAL_COMMUNITY)
Admission: RE | Admit: 2020-03-04 | Discharge: 2020-03-04 | Disposition: A | Discharge status: Home / Self Care | Payer: Managed Care, Other (non HMO) | Source: Ambulatory Visit | Attending: Surgery | Admitting: Surgery

## 2020-03-04 DIAGNOSIS — Z01818 Encounter for other preprocedural examination: Secondary | ICD-10-CM | POA: Insufficient documentation

## 2020-03-04 HISTORY — DX: Anxiety disorder, unspecified: F41.9

## 2020-03-04 HISTORY — DX: Essential (primary) hypertension: I10

## 2020-03-04 HISTORY — DX: Pneumonia, unspecified organism: J18.9

## 2020-03-04 HISTORY — DX: Personal history of urinary calculi: Z87.442

## 2020-03-04 HISTORY — DX: Abnormal levels of other serum enzymes: R74.8

## 2020-03-04 HISTORY — DX: Prediabetes: R73.03

## 2020-03-04 LAB — CBC WITH DIFFERENTIAL/PLATELET
Abs Immature Granulocytes: 0.04 10*3/uL (ref 0.00–0.07)
Basophils Absolute: 0 10*3/uL (ref 0.0–0.1)
Basophils Relative: 1 %
Eosinophils Absolute: 0.2 10*3/uL (ref 0.0–0.5)
Eosinophils Relative: 3 %
HCT: 43.8 % (ref 39.0–52.0)
Hemoglobin: 13.9 g/dL (ref 13.0–17.0)
Immature Granulocytes: 1 %
Lymphocytes Relative: 22 %
Lymphs Abs: 1.4 10*3/uL (ref 0.7–4.0)
MCH: 29 pg (ref 26.0–34.0)
MCHC: 31.7 g/dL (ref 30.0–36.0)
MCV: 91.4 fL (ref 80.0–100.0)
Monocytes Absolute: 0.4 10*3/uL (ref 0.1–1.0)
Monocytes Relative: 7 %
Neutro Abs: 4.5 10*3/uL (ref 1.7–7.7)
Neutrophils Relative %: 66 %
Platelets: 284 10*3/uL (ref 150–400)
RBC: 4.79 MIL/uL (ref 4.22–5.81)
RDW: 13.6 % (ref 11.5–15.5)
WBC: 6.7 10*3/uL (ref 4.0–10.5)
nRBC: 0 % (ref 0.0–0.2)

## 2020-03-04 LAB — BASIC METABOLIC PANEL
Anion gap: 9 (ref 5–15)
BUN: 15 mg/dL (ref 6–20)
CO2: 27 mmol/L (ref 22–32)
Calcium: 8.6 mg/dL — ABNORMAL LOW (ref 8.9–10.3)
Chloride: 106 mmol/L (ref 98–111)
Creatinine, Ser: 0.76 mg/dL (ref 0.61–1.24)
GFR, Estimated: >60 mL/min (ref >60)
Glucose, Bld: 88 mg/dL (ref 70–99)
Potassium: 4.5 mmol/L (ref 3.5–5.1)
Sodium: 142 mmol/L (ref 135–145)

## 2020-03-04 NOTE — Progress Notes (Addendum)
Anesthesia Review:  PCP: DR Orvan July- Requested LOV note  Cardiologist : DR Eden Emms - LOV 2012- epic  Cardiac Ct done - 2012- epic  Chest x-ray : EKG :03/04/20  Echo : Stress test: Cardiac Cath :  Activity level: can do a flight of stairs without difficulty  Sleep Study/ CPAP : yes  Fasting Blood Sugar :      / Checks Blood Sugar -- times a day:   Blood Thinner/ Instructions /Last Dose: ASA / Instructions/ Last Dose :  81 mg aspirin  Prediabetes- hgba1c done 02/09/20 from PCP on chart-5.8 along with LOV note from DR Modesto Charon.  And and office  Visit note from 2019

## 2020-03-13 ENCOUNTER — Other Ambulatory Visit (HOSPITAL_COMMUNITY): Payer: Managed Care, Other (non HMO)

## 2020-03-15 ENCOUNTER — Other Ambulatory Visit (HOSPITAL_COMMUNITY)
Admission: RE | Admit: 2020-03-15 | Discharge: 2020-03-15 | Disposition: A | Discharge status: Home / Self Care | Payer: Managed Care, Other (non HMO) | Source: Ambulatory Visit | Attending: Surgery | Admitting: Surgery

## 2020-03-15 DIAGNOSIS — Z20822 Contact with and (suspected) exposure to covid-19: Secondary | ICD-10-CM | POA: Insufficient documentation

## 2020-03-15 DIAGNOSIS — Z01812 Encounter for preprocedural laboratory examination: Secondary | ICD-10-CM | POA: Diagnosis not present

## 2020-03-15 LAB — SARS CORONAVIRUS 2 (TAT 6-24 HRS): SARS Coronavirus 2: NEGATIVE

## 2020-03-16 MED ORDER — BUPIVACAINE LIPOSOME 1.3 % IJ SUSP
20.0000 mL | Freq: Once | INTRAMUSCULAR | Status: DC
  Filled 2020-03-16: qty 20

## 2020-03-16 NOTE — Anesthesia Preprocedure Evaluation (Addendum)
Anesthesia Evaluation  Patient identified by MRN, date of birth, ID band Patient awake    Reviewed: Allergy & Precautions, NPO status , Patient's Chart, lab work & pertinent test results, reviewed documented beta blocker date and time   History of Anesthesia Complications Negative for: history of anesthetic complications  Airway Mallampati: III  TM Distance: >3 FB Neck ROM: Full    Dental  (+) Dental Advisory Given, Teeth Intact   Pulmonary sleep apnea and Continuous Positive Airway Pressure Ventilation ,    Pulmonary exam normal        Cardiovascular hypertension, Pt. on medications and Pt. on home beta blockers Normal cardiovascular exam     Neuro/Psych PSYCHIATRIC DISORDERS Anxiety Depression negative neurological ROS     GI/Hepatic negative GI ROS, Neg liver ROS,   Endo/Other   Pre-DM   Renal/GU negative Renal ROS     Musculoskeletal negative musculoskeletal ROS (+)   Abdominal   Peds  Hematology negative hematology ROS (+)   Anesthesia Other Findings Covid test negative   Reproductive/Obstetrics                            Anesthesia Physical Anesthesia Plan  ASA: II  Anesthesia Plan: General   Post-op Pain Management:    Induction: Intravenous  PONV Risk Score and Plan: 3 and Treatment may vary due to age or medical condition, Ondansetron, Dexamethasone and Midazolam  Airway Management Planned: Oral ETT  Additional Equipment: None  Intra-op Plan:   Post-operative Plan: Extubation in OR  Informed Consent: I have reviewed the patients History and Physical, chart, labs and discussed the procedure including the risks, benefits and alternatives for the proposed anesthesia with the patient or authorized representative who has indicated his/her understanding and acceptance.     Dental advisory given  Plan Discussed with: CRNA and Anesthesiologist  Anesthesia Plan  Comments:        Anesthesia Quick Evaluation

## 2020-03-17 ENCOUNTER — Ambulatory Visit (HOSPITAL_COMMUNITY): Payer: Managed Care, Other (non HMO) | Admitting: Physician Assistant

## 2020-03-17 ENCOUNTER — Ambulatory Visit (HOSPITAL_COMMUNITY): Payer: Managed Care, Other (non HMO) | Admitting: Certified Registered Nurse Anesthetist

## 2020-03-17 ENCOUNTER — Encounter (HOSPITAL_COMMUNITY)
Admission: RE | Disposition: A | Discharge status: Home / Self Care | Payer: Self-pay | Source: Home / Self Care | Attending: Surgery

## 2020-03-17 ENCOUNTER — Ambulatory Visit (HOSPITAL_COMMUNITY)
Admission: RE | Admit: 2020-03-17 | Discharge: 2020-03-17 | Disposition: A | Discharge status: Home / Self Care | Payer: Managed Care, Other (non HMO) | Attending: Surgery | Admitting: Surgery

## 2020-03-17 ENCOUNTER — Encounter (HOSPITAL_COMMUNITY): Payer: Self-pay | Admitting: Surgery

## 2020-03-17 DIAGNOSIS — Z880 Allergy status to penicillin: Secondary | ICD-10-CM | POA: Diagnosis not present

## 2020-03-17 DIAGNOSIS — Z8349 Family history of other endocrine, nutritional and metabolic diseases: Secondary | ICD-10-CM | POA: Diagnosis not present

## 2020-03-17 DIAGNOSIS — Z818 Family history of other mental and behavioral disorders: Secondary | ICD-10-CM | POA: Insufficient documentation

## 2020-03-17 DIAGNOSIS — Z9049 Acquired absence of other specified parts of digestive tract: Secondary | ICD-10-CM | POA: Insufficient documentation

## 2020-03-17 DIAGNOSIS — L720 Epidermal cyst: Secondary | ICD-10-CM | POA: Insufficient documentation

## 2020-03-17 DIAGNOSIS — Z888 Allergy status to other drugs, medicaments and biological substances status: Secondary | ICD-10-CM | POA: Insufficient documentation

## 2020-03-17 DIAGNOSIS — Z833 Family history of diabetes mellitus: Secondary | ICD-10-CM | POA: Diagnosis not present

## 2020-03-17 DIAGNOSIS — Z7982 Long term (current) use of aspirin: Secondary | ICD-10-CM | POA: Insufficient documentation

## 2020-03-17 DIAGNOSIS — R7303 Prediabetes: Secondary | ICD-10-CM | POA: Insufficient documentation

## 2020-03-17 DIAGNOSIS — I10 Essential (primary) hypertension: Secondary | ICD-10-CM | POA: Insufficient documentation

## 2020-03-17 DIAGNOSIS — Z8249 Family history of ischemic heart disease and other diseases of the circulatory system: Secondary | ICD-10-CM | POA: Insufficient documentation

## 2020-03-17 DIAGNOSIS — Z79899 Other long term (current) drug therapy: Secondary | ICD-10-CM | POA: Diagnosis not present

## 2020-03-17 DIAGNOSIS — Z881 Allergy status to other antibiotic agents status: Secondary | ICD-10-CM | POA: Insufficient documentation

## 2020-03-17 DIAGNOSIS — K429 Umbilical hernia without obstruction or gangrene: Secondary | ICD-10-CM | POA: Insufficient documentation

## 2020-03-17 DIAGNOSIS — G4730 Sleep apnea, unspecified: Secondary | ICD-10-CM | POA: Diagnosis not present

## 2020-03-17 DIAGNOSIS — Z803 Family history of malignant neoplasm of breast: Secondary | ICD-10-CM | POA: Diagnosis not present

## 2020-03-17 HISTORY — PX: CYST EXCISION: SHX5701

## 2020-03-17 HISTORY — PX: UMBILICAL HERNIA REPAIR: SHX196

## 2020-03-17 SURGERY — REPAIR, HERNIA, UMBILICAL, ADULT
Anesthesia: General | Site: Abdomen

## 2020-03-17 MED ORDER — PHENYLEPHRINE HCL-NACL 10-0.9 MG/250ML-% IV SOLN
INTRAVENOUS | Status: DC | PRN
  Administered 2020-03-17: 35 ug/min via INTRAVENOUS

## 2020-03-17 MED ORDER — DEXAMETHASONE SODIUM PHOSPHATE 10 MG/ML IJ SOLN
INTRAMUSCULAR | Status: AC
  Filled 2020-03-17: qty 1

## 2020-03-17 MED ORDER — PROMETHAZINE HCL 25 MG/ML IJ SOLN: 6.2500 mg | INTRAMUSCULAR | Status: DC | PRN

## 2020-03-17 MED ORDER — CHLORHEXIDINE GLUCONATE CLOTH 2 % EX PADS: 6.0000 | MEDICATED_PAD | Freq: Once | CUTANEOUS | Status: DC

## 2020-03-17 MED ORDER — LACTATED RINGERS IV SOLN: INTRAVENOUS | Status: DC

## 2020-03-17 MED ORDER — MIDAZOLAM HCL 2 MG/2ML IJ SOLN
INTRAMUSCULAR | Status: AC
  Filled 2020-03-17: qty 2

## 2020-03-17 MED ORDER — DEXAMETHASONE SODIUM PHOSPHATE 10 MG/ML IJ SOLN
INTRAMUSCULAR | Status: DC | PRN
  Administered 2020-03-17: 10 mg via INTRAVENOUS

## 2020-03-17 MED ORDER — PROPOFOL 10 MG/ML IV BOLUS
INTRAVENOUS | Status: AC
  Filled 2020-03-17: qty 20

## 2020-03-17 MED ORDER — LIDOCAINE HCL (PF) 2 % IJ SOLN
INTRAMUSCULAR | Status: AC
  Filled 2020-03-17: qty 5

## 2020-03-17 MED ORDER — OXYCODONE HCL 5 MG/5ML PO SOLN: 5.0000 mg | Freq: Once | ORAL | Status: DC | PRN

## 2020-03-17 MED ORDER — TRAMADOL HCL 50 MG PO TABS
50.0000 mg | ORAL_TABLET | Freq: Four times a day (QID) | ORAL | 0 refills | Status: AC | PRN
Start: 2020-03-17 — End: 2020-03-22

## 2020-03-17 MED ORDER — EPHEDRINE SULFATE-NACL 50-0.9 MG/10ML-% IV SOSY
PREFILLED_SYRINGE | INTRAVENOUS | Status: DC | PRN
  Administered 2020-03-17 (×2): 10 mg via INTRAVENOUS
  Administered 2020-03-17: 5 mg via INTRAVENOUS

## 2020-03-17 MED ORDER — BUPIVACAINE LIPOSOME 1.3 % IJ SUSP
INTRAMUSCULAR | Status: DC | PRN
  Administered 2020-03-17: 14 mL

## 2020-03-17 MED ORDER — LIDOCAINE 2% (20 MG/ML) 5 ML SYRINGE
INTRAMUSCULAR | Status: DC | PRN
  Administered 2020-03-17: 60 mg via INTRAVENOUS

## 2020-03-17 MED ORDER — PROPOFOL 10 MG/ML IV BOLUS
INTRAVENOUS | Status: DC | PRN
  Administered 2020-03-17: 200 mg via INTRAVENOUS

## 2020-03-17 MED ORDER — ACETAMINOPHEN 500 MG PO TABS
1000.0000 mg | ORAL_TABLET | ORAL | Status: AC
  Administered 2020-03-17: 1000 mg via ORAL
  Filled 2020-03-17: qty 2

## 2020-03-17 MED ORDER — ROCURONIUM BROMIDE 10 MG/ML (PF) SYRINGE
PREFILLED_SYRINGE | INTRAVENOUS | Status: AC
  Filled 2020-03-17: qty 10

## 2020-03-17 MED ORDER — CEFAZOLIN SODIUM-DEXTROSE 2-4 GM/100ML-% IV SOLN
2.0000 g | INTRAVENOUS | Status: AC
  Administered 2020-03-17: 2 g via INTRAVENOUS
  Filled 2020-03-17: qty 100

## 2020-03-17 MED ORDER — FENTANYL CITRATE (PF) 100 MCG/2ML IJ SOLN: 25.0000 ug | INTRAMUSCULAR | Status: DC | PRN

## 2020-03-17 MED ORDER — KETAMINE HCL 10 MG/ML IJ SOLN
INTRAMUSCULAR | Status: AC
  Filled 2020-03-17: qty 1

## 2020-03-17 MED ORDER — CHLORHEXIDINE GLUCONATE 0.12 % MT SOLN
15.0000 mL | Freq: Once | OROMUCOSAL | Status: AC
  Administered 2020-03-17: 15 mL via OROMUCOSAL

## 2020-03-17 MED ORDER — BUPIVACAINE-EPINEPHRINE 0.25% -1:200000 IJ SOLN
INTRAMUSCULAR | Status: DC | PRN
  Administered 2020-03-17: 14 mL

## 2020-03-17 MED ORDER — ROCURONIUM BROMIDE 10 MG/ML (PF) SYRINGE
PREFILLED_SYRINGE | INTRAVENOUS | Status: DC | PRN
  Administered 2020-03-17: 50 mg via INTRAVENOUS

## 2020-03-17 MED ORDER — EPHEDRINE 5 MG/ML INJ
INTRAVENOUS | Status: AC
  Filled 2020-03-17: qty 10

## 2020-03-17 MED ORDER — SUGAMMADEX SODIUM 200 MG/2ML IV SOLN
INTRAVENOUS | Status: DC | PRN
  Administered 2020-03-17: 200 mg via INTRAVENOUS

## 2020-03-17 MED ORDER — PHENYLEPHRINE HCL (PRESSORS) 10 MG/ML IV SOLN
INTRAVENOUS | Status: AC
  Filled 2020-03-17: qty 1

## 2020-03-17 MED ORDER — FENTANYL CITRATE (PF) 250 MCG/5ML IJ SOLN
INTRAMUSCULAR | Status: AC
  Filled 2020-03-17: qty 5

## 2020-03-17 MED ORDER — OXYCODONE HCL 5 MG PO TABS: 5.0000 mg | ORAL_TABLET | Freq: Once | ORAL | Status: DC | PRN

## 2020-03-17 MED ORDER — FENTANYL CITRATE (PF) 100 MCG/2ML IJ SOLN
INTRAMUSCULAR | Status: DC | PRN
  Administered 2020-03-17 (×3): 50 ug via INTRAVENOUS

## 2020-03-17 MED ORDER — MIDAZOLAM HCL 5 MG/5ML IJ SOLN
INTRAMUSCULAR | Status: DC | PRN
  Administered 2020-03-17: 2 mg via INTRAVENOUS

## 2020-03-17 MED ORDER — ONDANSETRON HCL 4 MG/2ML IJ SOLN
INTRAMUSCULAR | Status: DC | PRN
  Administered 2020-03-17: 4 mg via INTRAVENOUS

## 2020-03-17 MED ORDER — ONDANSETRON HCL 4 MG/2ML IJ SOLN
INTRAMUSCULAR | Status: AC
  Filled 2020-03-17: qty 2

## 2020-03-17 SURGICAL SUPPLY — 28 items
ADH SKN CLS APL DERMABOND .7 (GAUZE/BANDAGES/DRESSINGS) ×4
APL PRP STRL LF DISP 70% ISPRP (MISCELLANEOUS) ×2
CHLORAPREP W/TINT 26 (MISCELLANEOUS) ×4 IMPLANT
COVER SURGICAL LIGHT HANDLE (MISCELLANEOUS) ×4 IMPLANT
COVER WAND RF STERILE (DRAPES) IMPLANT
DECANTER SPIKE VIAL GLASS SM (MISCELLANEOUS) ×4 IMPLANT
DERMABOND ADVANCED (GAUZE/BANDAGES/DRESSINGS) ×4
DERMABOND ADVANCED .7 DNX12 (GAUZE/BANDAGES/DRESSINGS) ×2 IMPLANT
DRAPE LAPAROSCOPIC ABDOMINAL (DRAPES) ×4 IMPLANT
DRSG TEGADERM 4X4.75 (GAUZE/BANDAGES/DRESSINGS) ×2 IMPLANT
GAUZE SPONGE 4X4 12PLY STRL (GAUZE/BANDAGES/DRESSINGS) ×2 IMPLANT
GLOVE BIO SURGEON STRL SZ7.5 (GLOVE) ×4 IMPLANT
GLOVE INDICATOR 8.0 STRL GRN (GLOVE) ×4 IMPLANT
GOWN STRL REUS W/TWL XL LVL3 (GOWN DISPOSABLE) ×8 IMPLANT
KIT BASIN OR (CUSTOM PROCEDURE TRAY) ×4 IMPLANT
KIT TURNOVER KIT A (KITS) IMPLANT
NEEDLE HYPO 22GX1.5 SAFETY (NEEDLE) IMPLANT
NS IRRIG 1000ML POUR BTL (IV SOLUTION) ×4 IMPLANT
PACK GENERAL/GYN (CUSTOM PROCEDURE TRAY) ×4 IMPLANT
PENCIL SMOKE EVACUATOR (MISCELLANEOUS) IMPLANT
SPONGE LAP 18X18 RF (DISPOSABLE) IMPLANT
SUT ETHIBOND 0 MO6 C/R (SUTURE) ×4 IMPLANT
SUT MNCRL AB 4-0 PS2 18 (SUTURE) IMPLANT
SUT VIC AB 3-0 SH 27 (SUTURE)
SUT VIC AB 3-0 SH 27X BRD (SUTURE) IMPLANT
SYR 20ML LL LF (SYRINGE) IMPLANT
TOWEL OR 17X26 10 PK STRL BLUE (TOWEL DISPOSABLE) ×4 IMPLANT
TOWEL OR NON WOVEN STRL DISP B (DISPOSABLE) ×4 IMPLANT

## 2020-03-17 NOTE — Anesthesia Procedure Notes (Signed)
Procedure Name: Intubation Date/Time: 03/17/2020 8:41 AM Performed by: Maxwell Caul, CRNA Pre-anesthesia Checklist: Patient identified, Emergency Drugs available, Suction available and Patient being monitored Patient Re-evaluated:Patient Re-evaluated prior to induction Oxygen Delivery Method: Circle system utilized Preoxygenation: Pre-oxygenation with 100% oxygen Induction Type: IV induction Ventilation: Mask ventilation without difficulty and Oral airway inserted - appropriate to patient size Laryngoscope Size: Mac and 4 Grade View: Grade II Tube type: Oral Tube size: 7.5 mm Number of attempts: 1 Airway Equipment and Method: Stylet and Oral airway Placement Confirmation: ETT inserted through vocal cords under direct vision,  positive ETCO2 and breath sounds checked- equal and bilateral Secured at: 21 cm Tube secured with: Tape Dental Injury: Teeth and Oropharynx as per pre-operative assessment

## 2020-03-17 NOTE — Anesthesia Postprocedure Evaluation (Signed)
Anesthesia Post Note  Patient: Jonathan Ballard  Procedure(s) Performed: PRIMARY OPEN UMBILICAL HERNIA REPAIR (N/A Abdomen) EXCISION OF LEFT BUTTOCK CYST (Left )     Patient location during evaluation: PACU Anesthesia Type: General Level of consciousness: awake and alert Pain management: pain level controlled Vital Signs Assessment: post-procedure vital signs reviewed and stable Respiratory status: spontaneous breathing, nonlabored ventilation and respiratory function stable Cardiovascular status: blood pressure returned to baseline and stable Postop Assessment: no apparent nausea or vomiting Anesthetic complications: no   No complications documented.  Last Vitals:  Vitals:   03/17/20 1015 03/17/20 1035  BP: 116/72 119/69  Pulse: 88 87  Resp: (!) 24 16  Temp: 36.9 C 36.7 C  SpO2: 94% 91%    Last Pain:  Vitals:   03/17/20 1035  TempSrc:   PainSc: 0-No pain                 Beryle Lathe

## 2020-03-17 NOTE — Discharge Instructions (Signed)
POST OP INSTRUCTIONS  1. DIET: As tolerated. Follow a light bland diet the first 24 hours after arrival home, such as soup, liquids, crackers, etc.  Be sure to include lots of fluids daily.  Avoid fast food or heavy meals as your are more likely to get nauseated.  Eat a low fat the next few days after surgery.  2. Take your usually prescribed home medications unless otherwise directed.  3. PAIN CONTROL: a. Pain is best controlled by a usual combination of three different methods TOGETHER: i. Ice/Heat ii. Over the counter pain medication iii. Prescription pain medication b. Most patients will experience some swelling and bruising around the surgical site.  Ice packs or heating pads (30-60 minutes up to 6 times a day) will help. Some people prefer to use ice alone, heat alone, alternating between ice & heat.  Experiment to what works for you.  Swelling and bruising can take several weeks to resolve.   c. It is helpful to take an over-the-counter pain medication regularly for the first few weeks: i. Ibuprofen (Motrin/Advil) - 200mg  tabs - take 3 tabs (600mg ) every 6 hours as needed for pain ii. Acetaminophen (Tylenol) - you may take 650mg  every 6 hours as needed. You can take this with motrin as they act differently on the body. If you are taking a narcotic pain medication that has acetaminophen in it, do not take over the counter tylenol at the same time.  Iii. NOTE: You may take both of these medications together - most patients  find it most helpful when alternating between the two (i.e. Ibuprofen at 6am, tylenol at 9am, ibuprofen at 12pm ...) d. A  prescription for pain medication should be given to you upon discharge.  Take your pain medication as prescribed if your pain is not adequatly controlled with the over-the-counter pain reliefs mentioned above.  4. Avoid getting constipated.  Between the surgery and the pain medications, it is common to experience some constipation.  Increasing fluid  intake and taking a fiber supplement (such as Metamucil, Citrucel, FiberCon, MiraLax, etc) 1-2 times a day regularly will usually help prevent this problem from occurring.  A mild laxative (prune juice, Milk of Magnesia, MiraLax, etc) should be taken according to package directions if there are no bowel movements after 48 hours.    5. Dressing: Your incisions are covered in Dermabond which is like sterile superglue for the skin. This will come off on it's own in a couple weeks. The umbilical dressing may be removed in 48 hours; it is ok to get this wet in the shower. If the dressing were to become saturated sooner, you may remove it before the 48 hour mark. It is waterproof and you may bathe normally starting the day after your surgery in a shower. Avoid baths/pools/lakes/oceans until your wounds have fully healed.  6. ACTIVITIES as tolerated:   a. Avoid heavy lifting (>10lbs or 1 gallon of milk) for the next 6 weeks. b. You may resume regular (light) daily activities beginning the next day--such as daily self-care, walking, climbing stairs--gradually increasing activities as tolerated.  If you can walk 30 minutes without difficulty, it is safe to try more intense activity such as jogging, treadmill, bicycling, low-impact aerobics.  c. DO NOT PUSH THROUGH PAIN.  Let pain be your guide: If it hurts to do something, don't do it. d. may drive when you are no longer taking prescription pain medication, you can comfortably wear a seatbelt, and you can safely maneuver  your car and apply brakes.   7. FOLLOW UP in our office a. Please call [facility] at [phone] to set up an appointment to see your surgeon in the office for a follow-up appointment approximately 2 weeks after your surgery. b. Make sure that you call for this appointment the day you arrive home to insure a convenient appointment time.  9. If you have disability or family leave forms that need to be completed, you may have them completed by  your primary care physician's office; for return to work instructions, please ask our office staff and they will be happy to assist you in obtaining this documentation   When to call us [phone]: 1. Poor pain control 2. Reactions / problems with new medications (rash/itching, etc)  3. Fever over 101.5 F (38.5 C) 4. Inability to urinate 5. Nausea/vomiting 6. Worsening swelling or bruising 7. Continued bleeding from incision. 8. Increased pain, redness, or drainage from the incision  The clinic staff is available to answer your questions during regular business hours (8:30am-5pm).  Please don't hesitate to call and ask to speak to one of our nurses for clinical concerns.   A surgeon from [facility] is always on call at the hospitals   If you have a medical emergency, go to the nearest emergency room or call 911.  [facility] [address], [address], [place], [place]  [address] MAIN: [phone] FAX: [phone] [email or web address]

## 2020-03-17 NOTE — Interval H&P Note (Signed)
History and Physical Interval Note:  03/17/2020 8:27 AM  Jonathan Ballard  has presented today for surgery, with the diagnosis of SYMPTOMATIC UMBILICAL HERNIA+INCLUSION CYST LEFT BUTTOCK-2X2VM SQ.  The various methods of treatment have been discussed with the patient and family. After consideration of risks, benefits and other options for treatment, the patient has consented to  Procedure(s): OPEN UMBILICAL HERNIA REPAIR WITH POSSIBLE MESH PLACEMENT (N/A) EXCISION OF LEFT BUTTOCK CYST (Left) as a surgical intervention.  The patient's history has been reviewed, patient examined, no change in status, stable for surgery.  I have reviewed the patient's chart and labs.  Questions were answered to the patient's satisfaction.     Stephanie Coup Yacqub Baston

## 2020-03-17 NOTE — Op Note (Signed)
03/17/2020 9:47 AM  PATIENT: Jonathan Ballard  49 y.o. male  Patient Care Team: Ileana Ladd, MD as PCP - General (Family Medicine)  PRE-OPERATIVE DIAGNOSIS:  1. Symptomatic umbilical hernia 2. Left gluteal skin lesion  POST-OPERATIVE DIAGNOSIS:  1. Symptomatic umbilical hernia 2. Left gluteal skin lesion  PROCEDURE:  1. Primary repair of umbilical hernia (without mesh) 2. Excision of left gluteal skin lesion  SURGEON: Stephanie Coup. Filippa Yarbough, MD  ASSISTANT: Lannette Donath, MD  ANESTHESIA: General endotracheal  EBL: Total I/O In: 100 [IV Piggyback:100] Out: -   DRAINS: None  SPECIMEN: Left buttock cystic lesion  COUNTS: Sponge, needle and instrument counts were reported correct x2 at the conclusion of the operation  DISPOSITION: PACU in satisfactory condition  FINDINGS: Fat containing true umbilical hernia - just underneath 1 cm in size. Repaired primarily. Left gluteal subcutaneous cystic lesion - likely inclusion cyst excised.  DESCRIPTION:   The patient was identified & brought into the operating room. He was then positioned supine on the OR table. SCDs were in place and active during the entire case. He then underwent general endotracheal anesthesia. Pressure points were padded. Hair on the abdomen was clipped by the OR team. The abdomen was prepped and draped in the standard sterile fashion. Antibiotics were administered. A surgical timeout was performed and confirmed our plan.   A periumbilical incision was made. The umbilical stalk was grasped and retracted outwardly. A true umbilical hernia was identified containing omentum. The sac was opened sharply and the omentum reduced. The fascia was cleared circumferentially and the hernia sac excised. The fascial defect was just beneath 1 cm. Given this, a primary repair selected. This was closed transversely with interrupted 0 Ethibond sutures. The defect palpated and noted to be completely obliterated. Local anesthetic  consisting of a dilute mixture of marcaine  + Exparel infiltrated. The umbilical stalk reimplanted into the closure with 3-0 vicryl suture. 3-0 vicryl deep dermal sutures placed to obliterate the 'dead space.' Sponge, needle and instrument counts were reported correct. The skin then approximated with a running 4-0 Monocryl subcuticular suture. A dressing consisting of Dermabond followed by telfa/tegaderm placed.  He was then repositioned, re-prepped and draped in right lateral decubitus for the left gluteal skin lesion excision. The site was identified and marked previously. An elliptical incision was made over the lesion which is ~1.5 x 1.5 cm in size and involves the subcutaneous fat. The lesion is fully excised and found to contain sebum. This was passed off as specimen. The wound was then irrigated and hemostasis achieved with electrocautery. The wound was closed with deep dermal 3-0 vicryl followed by a running 4-0 Monocryl subcuticular suture. Sponge, needle and instrument counts were reported correct. Dermabond was placed.  He was then awakened from anesthesia, extubated, and transferred to a stretcher for transport to PACU in satisfactory condition.

## 2020-03-17 NOTE — Transfer of Care (Signed)
Immediate Anesthesia Transfer of Care Note  Patient: Jonathan Ballard  Procedure(s) Performed: PRIMARY OPEN UMBILICAL HERNIA REPAIR (N/A Abdomen) EXCISION OF LEFT BUTTOCK CYST (Left )  Patient Location: PACU  Anesthesia Type:General  Level of Consciousness: awake, alert  and oriented  Airway & Oxygen Therapy: Patient Spontanous Breathing and Patient connected to face mask oxygen  Post-op Assessment: Report given to RN and Post -op Vital signs reviewed and stable  Post vital signs: Reviewed and stable  Last Vitals:  Vitals Value Taken Time  BP 145/81 03/17/20 0945  Temp    Pulse 89 03/17/20 0945  Resp 22 03/17/20 0947  SpO2 99 % 03/17/20 0945  Vitals shown include unvalidated device data.  Last Pain:  Vitals:   03/17/20 0726  TempSrc:   PainSc: 0-No pain         Complications: No complications documented.

## 2020-03-18 ENCOUNTER — Encounter (HOSPITAL_COMMUNITY): Payer: Self-pay | Admitting: Surgery

## 2020-03-18 LAB — SURGICAL PATHOLOGY

## 2021-06-02 DIAGNOSIS — L409 Psoriasis, unspecified: Secondary | ICD-10-CM | POA: Diagnosis not present

## 2021-06-16 DIAGNOSIS — Z113 Encounter for screening for infections with a predominantly sexual mode of transmission: Secondary | ICD-10-CM | POA: Diagnosis not present

## 2021-06-16 DIAGNOSIS — E785 Hyperlipidemia, unspecified: Secondary | ICD-10-CM | POA: Diagnosis not present

## 2021-06-16 DIAGNOSIS — Y93B9 Activity, other involving muscle strengthening exercises: Secondary | ICD-10-CM | POA: Diagnosis not present

## 2021-06-16 DIAGNOSIS — E790 Hyperuricemia without signs of inflammatory arthritis and tophaceous disease: Secondary | ICD-10-CM | POA: Diagnosis not present

## 2021-06-16 DIAGNOSIS — Z111 Encounter for screening for respiratory tuberculosis: Secondary | ICD-10-CM | POA: Diagnosis not present

## 2021-06-16 DIAGNOSIS — M109 Gout, unspecified: Secondary | ICD-10-CM | POA: Diagnosis not present

## 2021-06-16 DIAGNOSIS — Z5181 Encounter for therapeutic drug level monitoring: Secondary | ICD-10-CM | POA: Diagnosis not present

## 2021-06-16 DIAGNOSIS — R2689 Other abnormalities of gait and mobility: Secondary | ICD-10-CM | POA: Diagnosis not present

## 2021-06-16 DIAGNOSIS — I10 Essential (primary) hypertension: Secondary | ICD-10-CM | POA: Diagnosis not present

## 2021-06-16 DIAGNOSIS — R7303 Prediabetes: Secondary | ICD-10-CM | POA: Diagnosis not present

## 2021-06-24 DIAGNOSIS — Y93B9 Activity, other involving muscle strengthening exercises: Secondary | ICD-10-CM | POA: Diagnosis not present

## 2021-06-24 DIAGNOSIS — R2689 Other abnormalities of gait and mobility: Secondary | ICD-10-CM | POA: Diagnosis not present

## 2021-06-27 DIAGNOSIS — Y93B9 Activity, other involving muscle strengthening exercises: Secondary | ICD-10-CM | POA: Diagnosis not present

## 2021-06-27 DIAGNOSIS — R2689 Other abnormalities of gait and mobility: Secondary | ICD-10-CM | POA: Diagnosis not present

## 2021-06-28 DIAGNOSIS — Y93B9 Activity, other involving muscle strengthening exercises: Secondary | ICD-10-CM | POA: Diagnosis not present

## 2021-06-28 DIAGNOSIS — R2689 Other abnormalities of gait and mobility: Secondary | ICD-10-CM | POA: Diagnosis not present

## 2021-06-30 DIAGNOSIS — G4733 Obstructive sleep apnea (adult) (pediatric): Secondary | ICD-10-CM | POA: Diagnosis not present

## 2021-07-01 DIAGNOSIS — R2689 Other abnormalities of gait and mobility: Secondary | ICD-10-CM | POA: Diagnosis not present

## 2021-07-01 DIAGNOSIS — Y93B9 Activity, other involving muscle strengthening exercises: Secondary | ICD-10-CM | POA: Diagnosis not present

## 2021-07-04 DIAGNOSIS — F411 Generalized anxiety disorder: Secondary | ICD-10-CM | POA: Diagnosis not present

## 2021-07-04 DIAGNOSIS — F331 Major depressive disorder, recurrent, moderate: Secondary | ICD-10-CM | POA: Diagnosis not present

## 2021-07-04 DIAGNOSIS — F909 Attention-deficit hyperactivity disorder, unspecified type: Secondary | ICD-10-CM | POA: Diagnosis not present

## 2021-07-04 DIAGNOSIS — F330 Major depressive disorder, recurrent, mild: Secondary | ICD-10-CM | POA: Diagnosis not present

## 2021-07-05 DIAGNOSIS — Y93B9 Activity, other involving muscle strengthening exercises: Secondary | ICD-10-CM | POA: Diagnosis not present

## 2021-07-05 DIAGNOSIS — R2689 Other abnormalities of gait and mobility: Secondary | ICD-10-CM | POA: Diagnosis not present

## 2021-07-08 DIAGNOSIS — Y93B9 Activity, other involving muscle strengthening exercises: Secondary | ICD-10-CM | POA: Diagnosis not present

## 2021-07-08 DIAGNOSIS — R2689 Other abnormalities of gait and mobility: Secondary | ICD-10-CM | POA: Diagnosis not present

## 2021-07-13 DIAGNOSIS — R2689 Other abnormalities of gait and mobility: Secondary | ICD-10-CM | POA: Diagnosis not present

## 2021-07-13 DIAGNOSIS — Y93B9 Activity, other involving muscle strengthening exercises: Secondary | ICD-10-CM | POA: Diagnosis not present

## 2021-07-19 DIAGNOSIS — R2689 Other abnormalities of gait and mobility: Secondary | ICD-10-CM | POA: Diagnosis not present

## 2021-07-19 DIAGNOSIS — Y93B9 Activity, other involving muscle strengthening exercises: Secondary | ICD-10-CM | POA: Diagnosis not present

## 2021-07-22 DIAGNOSIS — Y93B9 Activity, other involving muscle strengthening exercises: Secondary | ICD-10-CM | POA: Diagnosis not present

## 2021-07-22 DIAGNOSIS — R2689 Other abnormalities of gait and mobility: Secondary | ICD-10-CM | POA: Diagnosis not present

## 2021-08-03 DIAGNOSIS — Y93B9 Activity, other involving muscle strengthening exercises: Secondary | ICD-10-CM | POA: Diagnosis not present

## 2021-08-03 DIAGNOSIS — R2689 Other abnormalities of gait and mobility: Secondary | ICD-10-CM | POA: Diagnosis not present

## 2021-08-10 DIAGNOSIS — R319 Hematuria, unspecified: Secondary | ICD-10-CM | POA: Diagnosis not present

## 2021-08-10 DIAGNOSIS — Y93B9 Activity, other involving muscle strengthening exercises: Secondary | ICD-10-CM | POA: Diagnosis not present

## 2021-08-10 DIAGNOSIS — R8290 Unspecified abnormal findings in urine: Secondary | ICD-10-CM | POA: Diagnosis not present

## 2021-08-10 DIAGNOSIS — R2689 Other abnormalities of gait and mobility: Secondary | ICD-10-CM | POA: Diagnosis not present

## 2021-08-11 DIAGNOSIS — Y93B9 Activity, other involving muscle strengthening exercises: Secondary | ICD-10-CM | POA: Diagnosis not present

## 2021-08-11 DIAGNOSIS — R2689 Other abnormalities of gait and mobility: Secondary | ICD-10-CM | POA: Diagnosis not present

## 2021-09-01 DIAGNOSIS — Y93B9 Activity, other involving muscle strengthening exercises: Secondary | ICD-10-CM | POA: Diagnosis not present

## 2021-09-01 DIAGNOSIS — R2689 Other abnormalities of gait and mobility: Secondary | ICD-10-CM | POA: Diagnosis not present

## 2021-09-16 DIAGNOSIS — R310 Gross hematuria: Secondary | ICD-10-CM | POA: Diagnosis not present

## 2021-09-16 DIAGNOSIS — R1032 Left lower quadrant pain: Secondary | ICD-10-CM | POA: Diagnosis not present

## 2021-09-20 DIAGNOSIS — Y93B9 Activity, other involving muscle strengthening exercises: Secondary | ICD-10-CM | POA: Diagnosis not present

## 2021-09-20 DIAGNOSIS — R2689 Other abnormalities of gait and mobility: Secondary | ICD-10-CM | POA: Diagnosis not present

## 2021-09-22 DIAGNOSIS — Y93B9 Activity, other involving muscle strengthening exercises: Secondary | ICD-10-CM | POA: Diagnosis not present

## 2021-09-22 DIAGNOSIS — R2689 Other abnormalities of gait and mobility: Secondary | ICD-10-CM | POA: Diagnosis not present

## 2021-09-26 DIAGNOSIS — F330 Major depressive disorder, recurrent, mild: Secondary | ICD-10-CM | POA: Diagnosis not present

## 2021-09-26 DIAGNOSIS — F909 Attention-deficit hyperactivity disorder, unspecified type: Secondary | ICD-10-CM | POA: Diagnosis not present

## 2021-09-26 DIAGNOSIS — F331 Major depressive disorder, recurrent, moderate: Secondary | ICD-10-CM | POA: Diagnosis not present

## 2021-09-26 DIAGNOSIS — F411 Generalized anxiety disorder: Secondary | ICD-10-CM | POA: Diagnosis not present

## 2021-09-27 DIAGNOSIS — Y93B9 Activity, other involving muscle strengthening exercises: Secondary | ICD-10-CM | POA: Diagnosis not present

## 2021-09-27 DIAGNOSIS — R2689 Other abnormalities of gait and mobility: Secondary | ICD-10-CM | POA: Diagnosis not present

## 2021-09-29 DIAGNOSIS — Y93B9 Activity, other involving muscle strengthening exercises: Secondary | ICD-10-CM | POA: Diagnosis not present

## 2021-09-29 DIAGNOSIS — R2689 Other abnormalities of gait and mobility: Secondary | ICD-10-CM | POA: Diagnosis not present

## 2021-10-07 DIAGNOSIS — G4733 Obstructive sleep apnea (adult) (pediatric): Secondary | ICD-10-CM | POA: Diagnosis not present

## 2021-10-12 DIAGNOSIS — R310 Gross hematuria: Secondary | ICD-10-CM | POA: Diagnosis not present

## 2021-10-12 DIAGNOSIS — N132 Hydronephrosis with renal and ureteral calculous obstruction: Secondary | ICD-10-CM | POA: Diagnosis not present

## 2021-10-16 DIAGNOSIS — H53142 Visual discomfort, left eye: Secondary | ICD-10-CM | POA: Diagnosis not present

## 2021-10-16 DIAGNOSIS — H5712 Ocular pain, left eye: Secondary | ICD-10-CM | POA: Diagnosis not present

## 2021-10-16 DIAGNOSIS — R6889 Other general symptoms and signs: Secondary | ICD-10-CM | POA: Diagnosis not present

## 2021-10-16 DIAGNOSIS — H1089 Other conjunctivitis: Secondary | ICD-10-CM | POA: Diagnosis not present

## 2021-10-16 DIAGNOSIS — L299 Pruritus, unspecified: Secondary | ICD-10-CM | POA: Diagnosis not present

## 2021-10-16 DIAGNOSIS — Z79899 Other long term (current) drug therapy: Secondary | ICD-10-CM | POA: Diagnosis not present

## 2021-10-16 DIAGNOSIS — H1032 Unspecified acute conjunctivitis, left eye: Secondary | ICD-10-CM | POA: Diagnosis not present

## 2021-10-16 DIAGNOSIS — I10 Essential (primary) hypertension: Secondary | ICD-10-CM | POA: Diagnosis not present

## 2021-10-19 DIAGNOSIS — Z635 Disruption of family by separation and divorce: Secondary | ICD-10-CM | POA: Diagnosis not present

## 2021-10-19 DIAGNOSIS — Z569 Unspecified problems related to employment: Secondary | ICD-10-CM | POA: Diagnosis not present

## 2021-10-19 DIAGNOSIS — F341 Dysthymic disorder: Secondary | ICD-10-CM | POA: Diagnosis not present

## 2021-10-19 DIAGNOSIS — F4310 Post-traumatic stress disorder, unspecified: Secondary | ICD-10-CM | POA: Diagnosis not present

## 2021-10-24 DIAGNOSIS — H20012 Primary iridocyclitis, left eye: Secondary | ICD-10-CM | POA: Diagnosis not present

## 2021-10-27 DIAGNOSIS — H20012 Primary iridocyclitis, left eye: Secondary | ICD-10-CM | POA: Diagnosis not present

## 2021-10-28 ENCOUNTER — Other Ambulatory Visit: Payer: Self-pay | Admitting: Urology

## 2021-10-31 NOTE — Patient Instructions (Addendum)
DUE TO COVID-19 ONLY TWO VISITORS  (aged 53 and older)  ARE ALLOWED TO COME WITH YOU AND STAY IN THE WAITING ROOM ONLY DURING PRE OP AND PROCEDURE.   **NO VISITORS ARE ALLOWED IN THE SHORT STAY AREA OR RECOVERY ROOM!!**  IF YOU WILL BE ADMITTED INTO THE HOSPITAL YOU ARE ALLOWED ONLY FOUR SUPPORT PEOPLE DURING VISITATION HOURS ONLY (7 AM -8PM)   The support person(s) must pass our screening, gel in and out, and wear a mask at all times, including in the patient's room. Patients must also wear a mask when staff or their support person are in the room. Visitors GUEST BADGE MUST BE WORN VISIBLY  One adult visitor may remain with you overnight and MUST be in the room by 8 P.M.     Your procedure is scheduled on: 11/15/21   Report to [facility] Main Entrance    Report to admitting at : 5:15 AM   Call this number if you have problems the morning of surgery [phone]   Do not eat food :After Midnight.   After Midnight you may have the following liquids until: 4:30 AM DAY OF SURGERY  Water Black Coffee (sugar ok, NO MILK/CREAM OR CREAMERS)  Tea (sugar ok, NO MILK/CREAM OR CREAMERS) regular and decaf                             Plain Jell-O (NO RED)                                           Fruit ices (not with fruit pulp, NO RED)                                     Popsicles (NO RED)                                                                  Juice: apple, WHITE grape, WHITE cranberry Sports drinks like Gatorade (NO RED) Clear broth(vegetable,chicken,beef)    Oral Hygiene is also important to reduce your risk of infection.                                    Remember - BRUSH YOUR TEETH THE MORNING OF SURGERY WITH YOUR REGULAR TOOTHPASTE   Do NOT smoke after Midnight   Take these medicines the morning of surgery with A SIP OF WATER: citalopram,propranolol,amlodipine. Tylenol,fexofenadine as needed.  DO NOT TAKE ANY ORAL DIABETIC MEDICATIONS DAY OF YOUR SURGERY  Bring  CPAP mask and tubing day of surgery.                              You may not have any metal on your body including hair pins, jewelry, and body piercing             Do not wear lotions, powders, perfumes/cologne, or deodorant  Men may shave face and neck.   Do not bring valuables to the hospital. [facility] IS NOT             RESPONSIBLE   FOR VALUABLES.   Contacts, dentures or bridgework may not be worn into surgery.   Bring small overnight bag day of surgery.   DO NOT BRING YOUR HOME MEDICATIONS TO THE HOSPITAL. PHARMACY WILL DISPENSE MEDICATIONS LISTED ON YOUR MEDICATION LIST TO YOU DURING YOUR ADMISSION IN THE HOSPITAL!    Patients discharged on the day of surgery will not be allowed to drive home.  Someone NEEDS to stay with you for the first 24 hours after anesthesia.   Special Instructions: Bring a copy of your healthcare power of attorney and living will documents         the day of surgery if you haven't scanned them before.              Please read over the following fact sheets you were given: IF YOU HAVE QUESTIONS ABOUT YOUR PRE-OP INSTRUCTIONS PLEASE CALL [phone]     [facility] - Preparing for Surgery Before surgery, you can play an important role.  Because skin is not sterile, your skin needs to be as free of germs as possible.  You can reduce the number of germs on your skin by washing with CHG (chlorahexidine gluconate) soap before surgery.  CHG is an antiseptic cleaner which kills germs and bonds with the skin to continue killing germs even after washing. Please DO NOT use if you have an allergy to CHG or antibacterial soaps.  If your skin becomes reddened/irritated stop using the CHG and inform your nurse when you arrive at Short Stay. Do not shave (including legs and underarms) for at least 48 hours prior to the first CHG shower.  You may shave your face/neck. Please follow these instructions carefully:  1.  Shower with CHG Soap the night before  surgery and the  morning of Surgery.  2.  If you choose to wash your hair, wash your hair first as usual with your  normal  shampoo.  3.  After you shampoo, rinse your hair and body thoroughly to remove the  shampoo.                           4.  Use CHG as you would any other liquid soap.  You can apply chg directly  to the skin and wash                       Gently with a scrungie or clean washcloth.  5.  Apply the CHG Soap to your body ONLY FROM THE NECK DOWN.   Do not use on face/ open                           Wound or open sores. Avoid contact with eyes, ears mouth and genitals (private parts).                       Wash face,  Genitals (private parts) with your normal soap.             6.  Wash thoroughly, paying special attention to the area where your surgery  will be performed.  7.  Thoroughly rinse your body with warm water from the neck down.  8.  DO NOT shower/wash with your normal soap after using and rinsing off  the CHG Soap.                9.  Pat yourself dry with a clean towel.            10.  Wear clean pajamas.            11.  Place clean sheets on your bed the night of your first shower and do not  sleep with pets. Day of Surgery : Do not apply any lotions/deodorants the morning of surgery.  Please wear clean clothes to the hospital/surgery center.  FAILURE TO FOLLOW THESE INSTRUCTIONS MAY RESULT IN THE CANCELLATION OF YOUR SURGERY PATIENT SIGNATURE_________________________________  NURSE SIGNATURE__________________________________  ________________________________________________________________________

## 2021-10-31 NOTE — Progress Notes (Signed)
Pt. Needs orders for upcoming procedure. 

## 2021-11-01 ENCOUNTER — Encounter (HOSPITAL_COMMUNITY): Payer: Self-pay

## 2021-11-01 ENCOUNTER — Other Ambulatory Visit: Payer: Self-pay

## 2021-11-01 ENCOUNTER — Encounter (HOSPITAL_COMMUNITY)
Admission: RE | Admit: 2021-11-01 | Discharge: 2021-11-01 | Disposition: A | Discharge status: Home / Self Care | Payer: BC Managed Care – PPO | Source: Ambulatory Visit | Attending: Urology | Admitting: Urology

## 2021-11-01 VITALS — BP 143/88 | HR 66 | Temp 97.8°F | Ht 64.0 in | Wt 167.0 lb

## 2021-11-01 DIAGNOSIS — I2510 Atherosclerotic heart disease of native coronary artery without angina pectoris: Secondary | ICD-10-CM | POA: Diagnosis not present

## 2021-11-01 DIAGNOSIS — R7303 Prediabetes: Secondary | ICD-10-CM | POA: Insufficient documentation

## 2021-11-01 DIAGNOSIS — Z01818 Encounter for other preprocedural examination: Secondary | ICD-10-CM | POA: Insufficient documentation

## 2021-11-01 LAB — CBC
HCT: 41.7 % (ref 39.0–52.0)
Hemoglobin: 13.4 g/dL (ref 13.0–17.0)
MCH: 29.3 pg (ref 26.0–34.0)
MCHC: 32.1 g/dL (ref 30.0–36.0)
MCV: 91 fL (ref 80.0–100.0)
Platelets: 228 10*3/uL (ref 150–400)
RBC: 4.58 MIL/uL (ref 4.22–5.81)
RDW: 13.2 % (ref 11.5–15.5)
WBC: 6.4 10*3/uL (ref 4.0–10.5)
nRBC: 0 % (ref 0.0–0.2)

## 2021-11-01 LAB — BASIC METABOLIC PANEL
Anion gap: 6 (ref 5–15)
BUN: 12 mg/dL (ref 6–20)
CO2: 28 mmol/L (ref 22–32)
Calcium: 8.9 mg/dL (ref 8.9–10.3)
Chloride: 106 mmol/L (ref 98–111)
Creatinine, Ser: 0.82 mg/dL (ref 0.61–1.24)
GFR, Estimated: >60 mL/min (ref >60)
Glucose, Bld: 107 mg/dL — ABNORMAL HIGH (ref 70–99)
Potassium: 4 mmol/L (ref 3.5–5.1)
Sodium: 140 mmol/L (ref 135–145)

## 2021-11-01 LAB — GLUCOSE, CAPILLARY: Glucose-Capillary: 119 mg/dL — ABNORMAL HIGH (ref 70–99)

## 2021-11-01 LAB — HEMOGLOBIN A1C
Hgb A1c MFr Bld: 5.6 % (ref 4.8–5.6)
Mean Plasma Glucose: 114.02 mg/dL

## 2021-11-01 NOTE — Progress Notes (Addendum)
For Short Stay: COVID SWAB appointment date: Date of COVID positive in last 90 days:  Bowel Prep reminder:   For Anesthesia: PCP - Dr Leodis Sias Cardiologist -   Chest x-ray -  EKG -  Stress Test -  ECHO -  Cardiac Cath -  Pacemaker/ICD device last checked: Pacemaker orders received: Device Rep notified:  Spinal Cord Stimulator:  Sleep Study -  CPAP -   Fasting Blood Sugar -  Checks Blood Sugar _____ times a day Date and result of last Hgb A1c-  Blood Thinner Instructions: Aspirin Instructions: Last Dose:  Activity level: Can go up a flight of stairs and activities of daily living without stopping and without chest pain and/or shortness of breath   Able to exercise without chest pain and/or shortness of breath   Unable to go up a flight of stairs without chest pain and/or shortness of breath     Anesthesia review: Pre-DIA,HTN  Patient denies shortness of breath, fever, cough and chest pain at PAT appointment   Patient verbalized understanding of instructions that were given to them at the PAT appointment. Patient was also instructed that they will need to review over the PAT instructions again at home before surgery.

## 2021-11-02 DIAGNOSIS — F341 Dysthymic disorder: Secondary | ICD-10-CM | POA: Diagnosis not present

## 2021-11-02 DIAGNOSIS — Z569 Unspecified problems related to employment: Secondary | ICD-10-CM | POA: Diagnosis not present

## 2021-11-02 DIAGNOSIS — Z729 Problem related to lifestyle, unspecified: Secondary | ICD-10-CM | POA: Diagnosis not present

## 2021-11-08 NOTE — H&P (Signed)
CC/HPI: cc: gross hematuria   09/16/21: 51 year old man with a history of urolithiasis comes in with gross hematuria since March 2023. He feels like he passed a stone 1 week following the onset of hematuria in March. He has also been experiencing red urine after using the elliptical. Patient was a previous patient of Dr. Retta Diones. He has previously undergone a ureteroscopy and ESWL x2. He denies any nausea or vomiting. He is experiencing some intermittent left lower quadrant pain. He works as a Teacher, early years/pre.     ALLERGIES: Azithromycin PACK Niacin TABS Penicillin    MEDICATIONS: Crestor  Amlodipine Besylate 5 mg tablet  Celexa 20 mg tablet Oral  Clomiphene Citrate 50 mg tablet Oral  Coq-10  Dulcolax TBEC PRN  Folate  Gas X  Melatonin 3 mg tablet  Nuvigil 200 mg tablet Oral  Stelara 45 mg/0.5 ml syringe  Vitamin D3 CAPS Oral     GU PSH: ESWL - 2013, 2008 Remove Kidney Stone - 2008       PSH Notes: Lithotripsy, Lithotripsy, Lithotomy, Cholecystectomy, hernia repair   NON-GU PSH: Cholecystectomy (open) - 2008     GU PMH: Low back pain (Stable, Chronic) - 2018 Renal calculus, Nephrolithiasis - 2016, Kidney stone on left side, - 2015 Renal colic, Renal colic - 2016 Urinary Frequency, Urination frequency - 2015 Urinary Urgency, Urinary urgency - 2015 Ureteral calculus, Calculus of ureter - 2014    NON-GU PMH: Renal leak hypercalciuria (Chronic) - 2018 Encounter for general adult medical examination without abnormal findings, Encounter for preventive health examination - 2015 Gout, Gout - 2014 Personal history of diseases of the skin and subcutaneous tissue, History of psoriasis - 2014 Personal history of other diseases of the nervous system and sense organs, History of sleep apnea - 2014 Personal history of other endocrine, nutritional and metabolic disease, History of hypercholesterolemia - 2014    FAMILY HISTORY: 2 sons - Son Bladder Cancer - Father Breast Cancer -  Mother Diabetes - Father, Grandmother, Aunt Family Health Status Number - Runs In Family High Cholesterol - Brother, Father Hypothyroidism - Runs In Family Prostate Cancer - Grandfather, Runs In Family   SOCIAL HISTORY: Marital Status: Divorced Preferred Language: English; Ethnicity: Not Hispanic Or Latino; Race: White Current Smoking Status: Patient has never smoked.  Does not use smokeless tobacco. Has never drank.  Does not use drugs. Does not drink caffeine. Has not had a blood transfusion. 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     Notes: Occupation:, Never A Smoker, Caffeine Use, Marital History - Currently Married, Alcohol Use   REVIEW OF SYSTEMS:    GU Review Male:   Patient reports get up at night to urinate, leakage of urine, and erection problems. Patient denies frequent urination, hard to postpone urination, burning/ pain with urination, stream starts and stops, trouble starting your stream, have to strain to urinate , and penile pain.  Gastrointestinal (Upper):   Patient denies nausea, vomiting, and indigestion/ heartburn.  Gastrointestinal (Lower):   Patient denies diarrhea and constipation.  Constitutional:   Patient denies fever, fatigue, weight loss, and night sweats.  Skin:   Patient reports skin rash/ lesion. Patient denies itching.  Eyes:   Patient denies blurred vision and double vision.  Ears/ Nose/ Throat:   Patient denies sore throat and sinus problems.  Hematologic/Lymphatic:   Patient denies swollen glands and easy bruising.  Cardiovascular:   Patient denies leg swelling and chest pains.  Respiratory:   Patient denies cough and shortness of breath.  Endocrine:  Patient denies excessive thirst.  Musculoskeletal:   Patient denies back pain and joint pain.  Neurological:   Patient denies headaches and dizziness.  Psychologic:   Patient reports depression. Patient denies anxiety.   Notes: Blood in urine.    VITAL SIGNS:      09/16/2021 09:22 AM   Weight 163 lb / 73.94 kg  Height 64 in / 162.56 cm  BP 153/90 mmHg  Pulse 72 /min  Temperature 97.5 F / 36.3 C  BMI 28.0 kg/m   MULTI-SYSTEM PHYSICAL EXAMINATION:    Constitutional: Well-nourished. No physical deformities. Normally developed. Good grooming.  Neck: Neck symmetrical, not swollen. Normal tracheal position.  Respiratory: No labored breathing, no use of accessory muscles.   Skin: No paleness, no jaundice, no cyanosis. No lesion, no ulcer, no rash.  Neurologic / Psychiatric: Oriented to time, oriented to place, oriented to person. No depression, no anxiety, no agitation.  Eyes: Normal conjunctivae. Normal eyelids.  Ears, Nose, Mouth, and Throat: Left ear no scars, no lesions, no masses. Right ear no scars, no lesions, no masses. Nose no scars, no lesions, no masses. Normal hearing. Normal lips.  Musculoskeletal: Normal gait and station of head and neck.     Complexity of Data:  Records Review:   Previous Patient Records, POC Tool  Urine Test Review:   Urinalysis  Notes:                     06/16/2021: BUN 11, creatinine 0.8, estimated GFR 106   PROCEDURES:          Urinalysis w/Scope Dipstick Dipstick Cont'd Micro  Color: Yellow Bilirubin: Neg mg/dL WBC/hpf: 0 - 5/hpf  Appearance: Clear Ketones: Neg mg/dL RBC/hpf: 3 - 59/DGL  Specific Gravity: 1.015 Blood: 3+ ery/uL Bacteria: Rare (0-9/hpf)  pH: 5.5 Protein: Trace mg/dL Cystals: NS (Not Seen)  Glucose: Neg mg/dL Urobilinogen: 0.2 mg/dL Casts: NS (Not Seen)    Nitrites: Neg Trichomonas: Not Present    Leukocyte Esterase: 1+ leu/uL Mucous: Present      Epithelial Cells: 0 - 5/hpf      Yeast: NS (Not Seen)      Sperm: Not Present    ASSESSMENT:      ICD-10 Details  1 GU:   History of urolithiasis - Z87.442 Chronic, Stable  2   Gross hematuria - R31.0 Undiagnosed New Problem  3   LLQ pain - R10.32 Acute, Uncomplicated   PLAN:            Medications New Meds: Tamsulosin Hcl 0.4 mg capsule 1 capsule PO Q HS    #30  0 Refill(s)  Pharm[facility] Name:  [facility] [id number]  Address:  [address] [place], [place] [address]  Phone:  [phone]  Fax:  [phone]    Stop Meds: Ketorolac Tromethamine 10 mg tablet 1 tablet PO Q6 prn pain  Start: 10/28/2016  Discontinue: 09/16/2021  - Reason: The medication cycle was completed.            Orders Labs BMP, Urine Cytology  X-Rays: C.T. Abdomen/Pelvis With and Without I.V. Contrast          Schedule Return Visit/Planned Activity: Other See Visit Notes - Cystoscopy             Note: CT first          Document Letter(s):  Created for Patient: Clinical Summary         Notes:   Gross hematuria:  We discussed possible source of hematuria and we will proceed with an appropriate work-up including CT urogram and cystoscopy. We will also send urine for cytology today. Patient to have renal function checked as well. I also gave a prescription for tamsulosin in the event he is passing a stone. He is familiar with this medication.   cc: Leodis Sias, MD

## 2021-11-14 NOTE — Anesthesia Preprocedure Evaluation (Addendum)
Anesthesia Evaluation  Patient identified by MRN, date of birth, ID band Patient awake    Reviewed: Allergy & Precautions, NPO status , Patient's Chart, lab work & pertinent test results  History of Anesthesia Complications Negative for: history of anesthetic complications  Airway Mallampati: IV  TM Distance: >3 FB Neck ROM: Full    Dental no notable dental hx.    Pulmonary sleep apnea ,    Pulmonary exam normal        Cardiovascular hypertension, Pt. on medications Normal cardiovascular exam     Neuro/Psych Anxiety Depression negative neurological ROS     GI/Hepatic negative GI ROS, Neg liver ROS,   Endo/Other  negative endocrine ROS  Renal/GU Right renal stone  negative genitourinary   Musculoskeletal negative musculoskeletal ROS (+)   Abdominal   Peds  Hematology negative hematology ROS (+)   Anesthesia Other Findings Day of surgery medications reviewed with patient.  Reproductive/Obstetrics negative OB ROS                            Anesthesia Physical Anesthesia Plan  ASA: 2  Anesthesia Plan: General   Post-op Pain Management: Tylenol PO (pre-op)*   Induction: Intravenous  PONV Risk Score and Plan: 2 and Treatment may vary due to age or medical condition, Ondansetron, Dexamethasone and Midazolam  Airway Management Planned: LMA  Additional Equipment: None  Intra-op Plan:   Post-operative Plan: Extubation in OR  Informed Consent: I have reviewed the patients History and Physical, chart, labs and discussed the procedure including the risks, benefits and alternatives for the proposed anesthesia with the patient or authorized representative who has indicated his/her understanding and acceptance.     Dental advisory given  Plan Discussed with: CRNA  Anesthesia Plan Comments:        Anesthesia Quick Evaluation

## 2021-11-15 ENCOUNTER — Ambulatory Visit (HOSPITAL_COMMUNITY): Payer: BC Managed Care – PPO | Admitting: Anesthesiology

## 2021-11-15 ENCOUNTER — Ambulatory Visit (HOSPITAL_COMMUNITY)
Admission: RE | Admit: 2021-11-15 | Discharge: 2021-11-15 | Disposition: A | Discharge status: Home / Self Care | Payer: BC Managed Care – PPO | Source: Ambulatory Visit | Attending: Urology | Admitting: Urology

## 2021-11-15 ENCOUNTER — Ambulatory Visit (HOSPITAL_COMMUNITY): Payer: BC Managed Care – PPO

## 2021-11-15 ENCOUNTER — Encounter (HOSPITAL_COMMUNITY): Payer: Self-pay | Admitting: Urology

## 2021-11-15 ENCOUNTER — Encounter (HOSPITAL_COMMUNITY)
Admission: RE | Disposition: A | Discharge status: Home / Self Care | Payer: Self-pay | Source: Ambulatory Visit | Attending: Urology

## 2021-11-15 ENCOUNTER — Other Ambulatory Visit: Payer: Self-pay

## 2021-11-15 DIAGNOSIS — R310 Gross hematuria: Secondary | ICD-10-CM | POA: Insufficient documentation

## 2021-11-15 DIAGNOSIS — F32A Depression, unspecified: Secondary | ICD-10-CM | POA: Diagnosis not present

## 2021-11-15 DIAGNOSIS — I10 Essential (primary) hypertension: Secondary | ICD-10-CM | POA: Diagnosis not present

## 2021-11-15 DIAGNOSIS — G4730 Sleep apnea, unspecified: Secondary | ICD-10-CM | POA: Diagnosis not present

## 2021-11-15 DIAGNOSIS — N200 Calculus of kidney: Secondary | ICD-10-CM | POA: Diagnosis not present

## 2021-11-15 HISTORY — PX: CYSTOSCOPY WITH RETROGRADE PYELOGRAM, URETEROSCOPY AND STENT PLACEMENT: SHX5789

## 2021-11-15 HISTORY — PX: HOLMIUM LASER APPLICATION: SHX5852

## 2021-11-15 LAB — GLUCOSE, CAPILLARY: Glucose-Capillary: 101 mg/dL — ABNORMAL HIGH (ref 70–99)

## 2021-11-15 SURGERY — CYSTOURETEROSCOPY, WITH RETROGRADE PYELOGRAM AND STENT INSERTION
Anesthesia: General | Laterality: Right

## 2021-11-15 MED ORDER — PROPOFOL 10 MG/ML IV BOLUS
INTRAVENOUS | Status: AC
  Filled 2021-11-15: qty 20

## 2021-11-15 MED ORDER — TRAMADOL HCL 50 MG PO TABS: 50.0000 mg | ORAL_TABLET | Freq: Four times a day (QID) | ORAL | 0 refills | Status: AC | PRN

## 2021-11-15 MED ORDER — PHENYLEPHRINE HCL (PRESSORS) 10 MG/ML IV SOLN
INTRAVENOUS | Status: DC | PRN
  Administered 2021-11-15 (×4): 80 ug via INTRAVENOUS

## 2021-11-15 MED ORDER — MIDAZOLAM HCL 5 MG/5ML IJ SOLN
INTRAMUSCULAR | Status: DC | PRN
  Administered 2021-11-15: 2 mg via INTRAVENOUS

## 2021-11-15 MED ORDER — LIDOCAINE 2% (20 MG/ML) 5 ML SYRINGE
INTRAMUSCULAR | Status: DC | PRN
  Administered 2021-11-15: 60 mg via INTRAVENOUS

## 2021-11-15 MED ORDER — ALFUZOSIN HCL ER 10 MG PO TB24: 10.0000 mg | ORAL_TABLET | Freq: Every day | ORAL | 0 refills | Status: AC

## 2021-11-15 MED ORDER — FENTANYL CITRATE (PF) 100 MCG/2ML IJ SOLN
INTRAMUSCULAR | Status: DC | PRN
Start: 2021-11-15 — End: 2021-11-15
  Administered 2021-11-15 (×6): 25 ug via INTRAVENOUS
  Administered 2021-11-15: 50 ug via INTRAVENOUS

## 2021-11-15 MED ORDER — FENTANYL CITRATE (PF) 100 MCG/2ML IJ SOLN
INTRAMUSCULAR | Status: AC
  Filled 2021-11-15: qty 2

## 2021-11-15 MED ORDER — ONDANSETRON HCL 4 MG/2ML IJ SOLN
INTRAMUSCULAR | Status: DC | PRN
  Administered 2021-11-15: 4 mg via INTRAVENOUS

## 2021-11-15 MED ORDER — SULFAMETHOXAZOLE-TRIMETHOPRIM 800-160 MG PO TABS: 1.0000 | ORAL_TABLET | Freq: Once | ORAL | 0 refills | Status: AC

## 2021-11-15 MED ORDER — DEXAMETHASONE SODIUM PHOSPHATE 4 MG/ML IJ SOLN
INTRAMUSCULAR | Status: DC | PRN
  Administered 2021-11-15: 5 mg via INTRAVENOUS

## 2021-11-15 MED ORDER — OXYCODONE HCL 5 MG PO TABS: 5.0000 mg | ORAL_TABLET | Freq: Once | ORAL | Status: DC | PRN

## 2021-11-15 MED ORDER — CHLORHEXIDINE GLUCONATE 0.12 % MT SOLN
15.0000 mL | Freq: Once | OROMUCOSAL | Status: AC
  Administered 2021-11-15: 15 mL via OROMUCOSAL

## 2021-11-15 MED ORDER — IOHEXOL 300 MG/ML  SOLN
INTRAMUSCULAR | Status: DC | PRN
  Administered 2021-11-15: 7 mL

## 2021-11-15 MED ORDER — SODIUM CHLORIDE 0.9 % IR SOLN
Status: DC | PRN
  Administered 2021-11-15: 3000 mL via INTRAVESICAL

## 2021-11-15 MED ORDER — LACTATED RINGERS IV SOLN: INTRAVENOUS | Status: DC

## 2021-11-15 MED ORDER — AMISULPRIDE (ANTIEMETIC) 5 MG/2ML IV SOLN: 10.0000 mg | Freq: Once | INTRAVENOUS | Status: DC | PRN

## 2021-11-15 MED ORDER — 0.9 % SODIUM CHLORIDE (POUR BTL) OPTIME
TOPICAL | Status: DC | PRN
  Administered 2021-11-15: 1000 mL

## 2021-11-15 MED ORDER — PROPOFOL 10 MG/ML IV BOLUS
INTRAVENOUS | Status: DC | PRN
  Administered 2021-11-15: 150 mg via INTRAVENOUS

## 2021-11-15 MED ORDER — ACETAMINOPHEN 500 MG PO TABS
1000.0000 mg | ORAL_TABLET | Freq: Once | ORAL | Status: AC
  Administered 2021-11-15: 1000 mg via ORAL
  Filled 2021-11-15: qty 2

## 2021-11-15 MED ORDER — MIDAZOLAM HCL 2 MG/2ML IJ SOLN
INTRAMUSCULAR | Status: AC
  Filled 2021-11-15: qty 2

## 2021-11-15 MED ORDER — ONDANSETRON HCL 4 MG/2ML IJ SOLN
INTRAMUSCULAR | Status: AC
  Filled 2021-11-15: qty 2

## 2021-11-15 MED ORDER — OXYCODONE HCL 5 MG/5ML PO SOLN: 5.0000 mg | Freq: Once | ORAL | Status: DC | PRN

## 2021-11-15 MED ORDER — LIDOCAINE HCL (PF) 2 % IJ SOLN
INTRAMUSCULAR | Status: AC
  Filled 2021-11-15: qty 5

## 2021-11-15 MED ORDER — ORAL CARE MOUTH RINSE: 15.0000 mL | Freq: Once | OROMUCOSAL | Status: AC

## 2021-11-15 MED ORDER — FENTANYL CITRATE PF 50 MCG/ML IJ SOSY: 25.0000 ug | PREFILLED_SYRINGE | INTRAMUSCULAR | Status: DC | PRN

## 2021-11-15 MED ORDER — DEXAMETHASONE SODIUM PHOSPHATE 10 MG/ML IJ SOLN
INTRAMUSCULAR | Status: AC
  Filled 2021-11-15: qty 1

## 2021-11-15 MED ORDER — PHENYLEPHRINE 80 MCG/ML (10ML) SYRINGE FOR IV PUSH (FOR BLOOD PRESSURE SUPPORT)
PREFILLED_SYRINGE | INTRAVENOUS | Status: AC
  Filled 2021-11-15: qty 10

## 2021-11-15 MED ORDER — EPHEDRINE SULFATE (PRESSORS) 50 MG/ML IJ SOLN
INTRAMUSCULAR | Status: DC | PRN
  Administered 2021-11-15: 10 mg via INTRAVENOUS
  Administered 2021-11-15: 5 mg via INTRAVENOUS
  Administered 2021-11-15: 50 mg via INTRAVENOUS
  Administered 2021-11-15 (×2): 10 mg via INTRAVENOUS

## 2021-11-15 MED ORDER — EPHEDRINE 5 MG/ML INJ
INTRAVENOUS | Status: AC
  Filled 2021-11-15: qty 5

## 2021-11-15 MED ORDER — CEFAZOLIN SODIUM-DEXTROSE 2-4 GM/100ML-% IV SOLN
2.0000 g | INTRAVENOUS | Status: AC
  Administered 2021-11-15: 2 g via INTRAVENOUS
  Filled 2021-11-15: qty 100

## 2021-11-15 SURGICAL SUPPLY — 21 items
BAG URO CATCHER STRL LF (MISCELLANEOUS) ×2 IMPLANT
BASKET ZERO TIP NITINOL 2.4FR (BASKET) ×1 IMPLANT
BSKT STON RTRVL ZERO TP 2.4FR (BASKET) ×1
CATH URETL OPEN 5X70 (CATHETERS) ×2 IMPLANT
CLOTH BEACON ORANGE TIMEOUT ST (SAFETY) ×2 IMPLANT
DRSG TEGADERM 2-3/8X2-3/4 SM (GAUZE/BANDAGES/DRESSINGS) ×1 IMPLANT
EXTRACTOR STONE 1.7FRX115CM (UROLOGICAL SUPPLIES) IMPLANT
GLOVE BIO SURGEON STRL SZ 6.5 (GLOVE) ×2 IMPLANT
GOWN STRL REUS W/ TWL LRG LVL3 (GOWN DISPOSABLE) ×1 IMPLANT
GOWN STRL REUS W/TWL LRG LVL3 (GOWN DISPOSABLE) ×2
GUIDEWIRE STR DUAL SENSOR (WIRE) ×3 IMPLANT
KIT TURNOVER KIT A (KITS) IMPLANT
LASER FIB FLEXIVA PULSE ID 365 (Laser) IMPLANT
MANIFOLD NEPTUNE II (INSTRUMENTS) ×2 IMPLANT
PACK CYSTO (CUSTOM PROCEDURE TRAY) ×2 IMPLANT
SHEATH NAVIGATOR HD 11/13X28 (SHEATH) IMPLANT
SHEATH NAVIGATOR HD 11/13X36 (SHEATH) ×1 IMPLANT
TRACTIP FLEXIVA PULS ID 200XHI (Laser) IMPLANT
TRACTIP FLEXIVA PULSE ID 200 (Laser) ×4
TUBING CONNECTING 10 (TUBING) ×2 IMPLANT
TUBING UROLOGY SET (TUBING) ×2 IMPLANT

## 2021-11-15 NOTE — Discharge Instructions (Addendum)
DISCHARGE INSTRUCTIONS FOR KIDNEY STONE/URETERAL STENT   MEDICATIONS:  1. Resume all your other meds from home  2. AZO over the counter can help with the burning/stinging when you urinate. 3. Tramadol is for moderate/severe pain, otherwise taking up to 1000 mg every 6 hours of plainTylenol will help treat your pain.   4. Take  Bactrim one hour prior to removal of your stent.  5. Uroxatrol can help with stent discomfort   ACTIVITY:  1. No strenuous activity x 1week  2. No driving while on narcotic pain medications  3. Drink plenty of water  4. Continue to walk at home - you can still get blood clots when you are at home, so keep active, but don't over do it.  5. May return to work/school tomorrow or when you feel ready   BATHING:  1. You can shower and we recommend daily showers  2. You have a string coming from your urethra: The stent string is attached to your ureteral stent. Do not pull on this.   SIGNS/SYMPTOMS TO CALL:  Please call us if you have a fever greater than 101.5, uncontrolled nausea/vomiting, uncontrolled pain, dizziness, unable to urinate, bloody urine, chest pain, shortness of breath, leg swelling, leg pain, redness around wound, drainage from wound, or any other concerns or questions.   You can reach [place] at [phone].   FOLLOW-UP:  1. You have a string attached to your stent, you may remove it on Tuesday, 11/22/21. To do this, pull the strings until the stent is completely removed. You may feel an odd sensation in your back.

## 2021-11-15 NOTE — Anesthesia Postprocedure Evaluation (Signed)
Anesthesia Post Note  Patient: Jonathan Ballard  Procedure(s) Performed: CYSTOSCOPY WITH RETROGRADE PYELOGRAM, URETEROSCOPY AND STENT PLACEMENT (Right) HOLMIUM LASER APPLICATION (Right)     Patient location during evaluation: PACU Anesthesia Type: General Level of consciousness: awake and alert Pain management: pain level controlled Vital Signs Assessment: post-procedure vital signs reviewed and stable Respiratory status: spontaneous breathing, nonlabored ventilation and respiratory function stable Cardiovascular status: blood pressure returned to baseline Postop Assessment: no apparent nausea or vomiting Anesthetic complications: no   No notable events documented.  Last Vitals:  Vitals:   11/15/21 0930 11/15/21 0935  BP: 130/71 133/82  Pulse: 97   Resp: 20 20  Temp: 36.6 C 36.4 C  SpO2: 93% 93%    Last Pain:  Vitals:   11/15/21 0935  TempSrc:   PainSc: 0-No pain                 Shanda Howells

## 2021-11-15 NOTE — Interval H&P Note (Signed)
History and Physical Interval Note:  11/15/2021 7:04 AM  Jonathan Ballard  has presented today for surgery, with the diagnosis of RIGHT RENAL STONE.  The various methods of treatment have been discussed with the patient and family. After consideration of risks, benefits and other options for treatment, the patient has consented to  Procedure(s) with comments: CYSTOSCOPY WITH RETROGRADE PYELOGRAM, URETEROSCOPY AND STENT PLACEMENT (Right) - 90 MINS HOLMIUM LASER APPLICATION (Right) as a surgical intervention.  The patient's history has been reviewed, patient examined, no change in status, stable for surgery.  I have reviewed the patient's chart and labs.  Questions were answered to the patient's satisfaction.     Floria Brandau D Haider Hornaday

## 2021-11-15 NOTE — Transfer of Care (Signed)
Immediate Anesthesia Transfer of Care Note  Patient: Jonathan Ballard  Procedure(s) Performed: Procedure(s) (LRB): CYSTOSCOPY WITH RETROGRADE PYELOGRAM, URETEROSCOPY AND STENT PLACEMENT (Right) HOLMIUM LASER APPLICATION (Right)  Patient Location: PACU  Anesthesia Type: General  Level of Consciousness: awake, sedated, patient cooperative and responds to stimulation  Airway & Oxygen Therapy: Patient Spontanous Breathing and Patient connected to face mask oxygen  Post-op Assessment: Report given to PACU RN, Post -op Vital signs reviewed and stable and Patient moving all extremities  Post vital signs: Reviewed and stable  Complications: No apparent anesthesia complications

## 2021-11-15 NOTE — Op Note (Signed)
Preoperative diagnosis: right renal calculus  Postoperative diagnosis: right renal calculus  Procedure:  Cystoscopy right ureteroscopy, laser lithotripsy, basket stone extraction right 110F x 26 ureteral stent placement  right retrograde pyelography with interpretation  Surgeon: Kasandra Knudsen, MD  Anesthesia: General  Complications: None  Intraoperative findings:  Normal urethra Normal prostatic urethral  Bilateral orthotropic ureteral orifices right retrograde pyelography demonstrated a filling defect within the right renal pelvis consistent with the patient's known calculus without other abnormalities. Bladder mucosa normal without masses   EBL: Minimal  Specimens: right ureteral calculus  Disposition of specimens: [facility] for stone analysis  Indication: [name] is a 51 y.o.   patient with a 2cm right renal pelvis stone and associated right symptoms. After reviewing the management options for treatment, the patient elected to proceed with the above surgical procedure(s). We have discussed the potential benefits and risks of the procedure, side effects of the proposed treatment, the likelihood of the patient achieving the goals of the procedure, and any potential problems that might occur during the procedure or recuperation. Informed consent has been obtained.   Description of procedure:  The patient was taken to the operating room and general anesthesia was induced.  The patient was placed in the dorsal lithotomy position, prepped and draped in the usual sterile fashion, and preoperative antibiotics were administered. A preoperative time-out was performed.   Cystourethroscopy was performed.  The patient's urethra was examined and was normal.  The bladder was then systematically examined in its entirety. There was no evidence for any bladder tumors, stones, or other mucosal pathology.    Attention then turned to the right ureteral orifice and a  ureteral catheter was used to intubate the ureteral orifice.  Omnipaque contrast was injected through the ureteral catheter and a retrograde pyelogram was performed with findings as dictated above.  A 0.38 sensor guidewire was then advanced through the open-ended ureteral catheter and into the right ureter and renal pelvis under fluoroscopic guidance.  The open-ended ureteral catheter was then removed.  A second sensor wire was placed alongside the first wire in similar manner.  Next the ureteral access sheath was placed over one of the wires and advanced to the proximal ureter with fluoroscopic guidance.  The inner sheath and wire removed.    The large renal pelvis stone was then encountered and a 242 m holmium laser fiber was then used to break up the stone.  The stone was dusted into very small pieces and the larger fragments were removed with a 0 tip basket.  Visualization became poor towards the end of the case and there did not appear to be any fragments larger than 1 to 2 mm.  The ureteral access sheath was then removed and unison with the ureteroscope taking care to examine the ureter on the way out.  No trauma was noted to the ureter.  There were no remaining stone fragments along the ureter.  The wire was then backloaded through the cystoscope and a ureteral stent was advance over the wire using Seldinger technique.  The stent was positioned appropriately under fluoroscopic and cystoscopic guidance.  The wire was then removed with an adequate stent curl noted in the renal pelvis as well as in the bladder.  The bladder was then emptied and the procedure ended.  The patient appeared to tolerate the procedure well and without complications.  The patient was able to be awakened and transferred to the recovery unit in satisfactory condition.   Disposition:  The tether of the stent was left on and secured to the ventral aspect of the patient's penis. t Instructions for removing the stent have been  provided to the patient.

## 2021-11-15 NOTE — Anesthesia Procedure Notes (Signed)
Procedure Name: LMA Insertion Date/Time: 11/15/2021 7:26 AM  Performed by: Jessica Priest, CRNAPre-anesthesia Checklist: Patient identified, Emergency Drugs available, Suction available, Patient being monitored and Timeout performed Patient Re-evaluated:Patient Re-evaluated prior to induction Oxygen Delivery Method: Circle system utilized Preoxygenation: Pre-oxygenation with 100% oxygen Induction Type: IV induction Ventilation: Mask ventilation without difficulty LMA: LMA inserted LMA Size: 5.0 Number of attempts: 1 Airway Equipment and Method: Bite block Placement Confirmation: positive ETCO2, breath sounds checked- equal and bilateral and CO2 detector Tube secured with: Tape Dental Injury: Teeth and Oropharynx as per pre-operative assessment

## 2021-11-16 ENCOUNTER — Encounter (HOSPITAL_COMMUNITY): Payer: Self-pay | Admitting: Urology

## 2021-11-16 DIAGNOSIS — F4310 Post-traumatic stress disorder, unspecified: Secondary | ICD-10-CM | POA: Diagnosis not present

## 2021-11-16 DIAGNOSIS — Z729 Problem related to lifestyle, unspecified: Secondary | ICD-10-CM | POA: Diagnosis not present

## 2021-11-16 DIAGNOSIS — Z635 Disruption of family by separation and divorce: Secondary | ICD-10-CM | POA: Diagnosis not present

## 2021-11-16 DIAGNOSIS — F341 Dysthymic disorder: Secondary | ICD-10-CM | POA: Diagnosis not present

## 2021-11-28 DIAGNOSIS — L918 Other hypertrophic disorders of the skin: Secondary | ICD-10-CM | POA: Diagnosis not present

## 2021-11-28 DIAGNOSIS — I10 Essential (primary) hypertension: Secondary | ICD-10-CM | POA: Diagnosis not present

## 2021-11-28 DIAGNOSIS — L409 Psoriasis, unspecified: Secondary | ICD-10-CM | POA: Diagnosis not present

## 2021-11-28 DIAGNOSIS — E785 Hyperlipidemia, unspecified: Secondary | ICD-10-CM | POA: Diagnosis not present

## 2021-11-28 DIAGNOSIS — H209 Unspecified iridocyclitis: Secondary | ICD-10-CM | POA: Diagnosis not present

## 2021-11-28 DIAGNOSIS — R7303 Prediabetes: Secondary | ICD-10-CM | POA: Diagnosis not present

## 2021-11-30 DIAGNOSIS — Z729 Problem related to lifestyle, unspecified: Secondary | ICD-10-CM | POA: Diagnosis not present

## 2021-11-30 DIAGNOSIS — F341 Dysthymic disorder: Secondary | ICD-10-CM | POA: Diagnosis not present

## 2021-11-30 DIAGNOSIS — Z635 Disruption of family by separation and divorce: Secondary | ICD-10-CM | POA: Diagnosis not present

## 2021-11-30 DIAGNOSIS — F4310 Post-traumatic stress disorder, unspecified: Secondary | ICD-10-CM | POA: Diagnosis not present

## 2021-12-13 DIAGNOSIS — N200 Calculus of kidney: Secondary | ICD-10-CM | POA: Diagnosis not present

## 2021-12-14 DIAGNOSIS — Z569 Unspecified problems related to employment: Secondary | ICD-10-CM | POA: Diagnosis not present

## 2021-12-14 DIAGNOSIS — Z729 Problem related to lifestyle, unspecified: Secondary | ICD-10-CM | POA: Diagnosis not present

## 2021-12-14 DIAGNOSIS — F341 Dysthymic disorder: Secondary | ICD-10-CM | POA: Diagnosis not present

## 2021-12-14 DIAGNOSIS — Z635 Disruption of family by separation and divorce: Secondary | ICD-10-CM | POA: Diagnosis not present

## 2021-12-21 DIAGNOSIS — F330 Major depressive disorder, recurrent, mild: Secondary | ICD-10-CM | POA: Diagnosis not present

## 2021-12-21 DIAGNOSIS — F331 Major depressive disorder, recurrent, moderate: Secondary | ICD-10-CM | POA: Diagnosis not present

## 2021-12-21 DIAGNOSIS — F411 Generalized anxiety disorder: Secondary | ICD-10-CM | POA: Diagnosis not present

## 2021-12-21 DIAGNOSIS — F909 Attention-deficit hyperactivity disorder, unspecified type: Secondary | ICD-10-CM | POA: Diagnosis not present

## 2021-12-28 DIAGNOSIS — F341 Dysthymic disorder: Secondary | ICD-10-CM | POA: Diagnosis not present

## 2021-12-28 DIAGNOSIS — Z729 Problem related to lifestyle, unspecified: Secondary | ICD-10-CM | POA: Diagnosis not present

## 2021-12-28 DIAGNOSIS — Z635 Disruption of family by separation and divorce: Secondary | ICD-10-CM | POA: Diagnosis not present

## 2021-12-28 DIAGNOSIS — Z569 Unspecified problems related to employment: Secondary | ICD-10-CM | POA: Diagnosis not present

## 2022-01-11 DIAGNOSIS — F341 Dysthymic disorder: Secondary | ICD-10-CM | POA: Diagnosis not present

## 2022-01-11 DIAGNOSIS — Z569 Unspecified problems related to employment: Secondary | ICD-10-CM | POA: Diagnosis not present

## 2022-01-11 DIAGNOSIS — Z729 Problem related to lifestyle, unspecified: Secondary | ICD-10-CM | POA: Diagnosis not present

## 2022-01-13 DIAGNOSIS — G4733 Obstructive sleep apnea (adult) (pediatric): Secondary | ICD-10-CM | POA: Diagnosis not present

## 2022-01-15 DIAGNOSIS — K648 Other hemorrhoids: Secondary | ICD-10-CM | POA: Diagnosis not present

## 2022-01-15 DIAGNOSIS — S60351A Superficial foreign body of right thumb, initial encounter: Secondary | ICD-10-CM | POA: Diagnosis not present

## 2022-01-25 DIAGNOSIS — Z569 Unspecified problems related to employment: Secondary | ICD-10-CM | POA: Diagnosis not present

## 2022-01-25 DIAGNOSIS — F341 Dysthymic disorder: Secondary | ICD-10-CM | POA: Diagnosis not present

## 2022-01-25 DIAGNOSIS — Z729 Problem related to lifestyle, unspecified: Secondary | ICD-10-CM | POA: Diagnosis not present

## 2022-01-25 DIAGNOSIS — Z635 Disruption of family by separation and divorce: Secondary | ICD-10-CM | POA: Diagnosis not present

## 2022-01-27 DIAGNOSIS — E785 Hyperlipidemia, unspecified: Secondary | ICD-10-CM | POA: Diagnosis not present

## 2022-01-27 DIAGNOSIS — I10 Essential (primary) hypertension: Secondary | ICD-10-CM | POA: Diagnosis not present

## 2022-01-27 DIAGNOSIS — R7303 Prediabetes: Secondary | ICD-10-CM | POA: Diagnosis not present

## 2022-01-27 DIAGNOSIS — Z713 Dietary counseling and surveillance: Secondary | ICD-10-CM | POA: Diagnosis not present

## 2022-01-30 DIAGNOSIS — N200 Calculus of kidney: Secondary | ICD-10-CM | POA: Diagnosis not present

## 2022-02-08 DIAGNOSIS — Z729 Problem related to lifestyle, unspecified: Secondary | ICD-10-CM | POA: Diagnosis not present

## 2022-02-08 DIAGNOSIS — F341 Dysthymic disorder: Secondary | ICD-10-CM | POA: Diagnosis not present

## 2022-02-08 DIAGNOSIS — Z569 Unspecified problems related to employment: Secondary | ICD-10-CM | POA: Diagnosis not present

## 2022-02-08 DIAGNOSIS — Z635 Disruption of family by separation and divorce: Secondary | ICD-10-CM | POA: Diagnosis not present

## 2022-02-22 DIAGNOSIS — F341 Dysthymic disorder: Secondary | ICD-10-CM | POA: Diagnosis not present

## 2022-02-22 DIAGNOSIS — Z729 Problem related to lifestyle, unspecified: Secondary | ICD-10-CM | POA: Diagnosis not present

## 2022-02-22 DIAGNOSIS — Z569 Unspecified problems related to employment: Secondary | ICD-10-CM | POA: Diagnosis not present

## 2022-02-22 DIAGNOSIS — Z635 Disruption of family by separation and divorce: Secondary | ICD-10-CM | POA: Diagnosis not present

## 2022-02-27 DIAGNOSIS — K4090 Unilateral inguinal hernia, without obstruction or gangrene, not specified as recurrent: Secondary | ICD-10-CM | POA: Diagnosis not present

## 2022-02-27 DIAGNOSIS — Z87448 Personal history of other diseases of urinary system: Secondary | ICD-10-CM | POA: Diagnosis not present

## 2022-02-27 DIAGNOSIS — I700 Atherosclerosis of aorta: Secondary | ICD-10-CM | POA: Diagnosis not present

## 2022-02-27 DIAGNOSIS — N132 Hydronephrosis with renal and ureteral calculous obstruction: Secondary | ICD-10-CM | POA: Diagnosis not present

## 2022-02-27 DIAGNOSIS — N200 Calculus of kidney: Secondary | ICD-10-CM | POA: Diagnosis not present

## 2022-03-08 DIAGNOSIS — F341 Dysthymic disorder: Secondary | ICD-10-CM | POA: Diagnosis not present

## 2022-03-08 DIAGNOSIS — Z635 Disruption of family by separation and divorce: Secondary | ICD-10-CM | POA: Diagnosis not present

## 2022-03-08 DIAGNOSIS — Z638 Other specified problems related to primary support group: Secondary | ICD-10-CM | POA: Diagnosis not present

## 2022-03-17 DIAGNOSIS — F411 Generalized anxiety disorder: Secondary | ICD-10-CM | POA: Diagnosis not present

## 2022-03-17 DIAGNOSIS — F330 Major depressive disorder, recurrent, mild: Secondary | ICD-10-CM | POA: Diagnosis not present

## 2022-03-17 DIAGNOSIS — F909 Attention-deficit hyperactivity disorder, unspecified type: Secondary | ICD-10-CM | POA: Diagnosis not present

## 2022-03-17 DIAGNOSIS — F331 Major depressive disorder, recurrent, moderate: Secondary | ICD-10-CM | POA: Diagnosis not present

## 2022-03-22 DIAGNOSIS — Z635 Disruption of family by separation and divorce: Secondary | ICD-10-CM | POA: Diagnosis not present

## 2022-03-22 DIAGNOSIS — F341 Dysthymic disorder: Secondary | ICD-10-CM | POA: Diagnosis not present

## 2022-03-22 DIAGNOSIS — Z569 Unspecified problems related to employment: Secondary | ICD-10-CM | POA: Diagnosis not present

## 2022-04-05 DIAGNOSIS — F341 Dysthymic disorder: Secondary | ICD-10-CM | POA: Diagnosis not present

## 2022-04-05 DIAGNOSIS — Z569 Unspecified problems related to employment: Secondary | ICD-10-CM | POA: Diagnosis not present

## 2022-04-05 DIAGNOSIS — Z635 Disruption of family by separation and divorce: Secondary | ICD-10-CM | POA: Diagnosis not present

## 2022-04-05 DIAGNOSIS — F4310 Post-traumatic stress disorder, unspecified: Secondary | ICD-10-CM | POA: Diagnosis not present

## 2022-04-18 DIAGNOSIS — G4733 Obstructive sleep apnea (adult) (pediatric): Secondary | ICD-10-CM | POA: Diagnosis not present

## 2022-04-19 DIAGNOSIS — Z635 Disruption of family by separation and divorce: Secondary | ICD-10-CM | POA: Diagnosis not present

## 2022-04-19 DIAGNOSIS — F4310 Post-traumatic stress disorder, unspecified: Secondary | ICD-10-CM | POA: Diagnosis not present

## 2022-04-19 DIAGNOSIS — R69 Illness, unspecified: Secondary | ICD-10-CM | POA: Diagnosis not present

## 2022-04-19 DIAGNOSIS — Z569 Unspecified problems related to employment: Secondary | ICD-10-CM | POA: Diagnosis not present

## 2022-05-03 DIAGNOSIS — F4310 Post-traumatic stress disorder, unspecified: Secondary | ICD-10-CM | POA: Diagnosis not present

## 2022-05-03 DIAGNOSIS — Z638 Other specified problems related to primary support group: Secondary | ICD-10-CM | POA: Diagnosis not present

## 2022-05-03 DIAGNOSIS — R69 Illness, unspecified: Secondary | ICD-10-CM | POA: Diagnosis not present

## 2022-05-03 DIAGNOSIS — Z635 Disruption of family by separation and divorce: Secondary | ICD-10-CM | POA: Diagnosis not present

## 2022-05-12 DIAGNOSIS — E291 Testicular hypofunction: Secondary | ICD-10-CM | POA: Diagnosis not present

## 2022-05-12 DIAGNOSIS — M7712 Lateral epicondylitis, left elbow: Secondary | ICD-10-CM | POA: Diagnosis not present

## 2022-05-12 DIAGNOSIS — E559 Vitamin D deficiency, unspecified: Secondary | ICD-10-CM | POA: Diagnosis not present

## 2022-05-12 DIAGNOSIS — Z6829 Body mass index (BMI) 29.0-29.9, adult: Secondary | ICD-10-CM | POA: Diagnosis not present

## 2022-05-15 DIAGNOSIS — G4733 Obstructive sleep apnea (adult) (pediatric): Secondary | ICD-10-CM | POA: Diagnosis not present

## 2022-05-17 DIAGNOSIS — Z635 Disruption of family by separation and divorce: Secondary | ICD-10-CM | POA: Diagnosis not present

## 2022-05-17 DIAGNOSIS — R69 Illness, unspecified: Secondary | ICD-10-CM | POA: Diagnosis not present

## 2022-05-17 DIAGNOSIS — Z638 Other specified problems related to primary support group: Secondary | ICD-10-CM | POA: Diagnosis not present

## 2022-05-30 DIAGNOSIS — L820 Inflamed seborrheic keratosis: Secondary | ICD-10-CM | POA: Diagnosis not present

## 2022-05-30 DIAGNOSIS — L409 Psoriasis, unspecified: Secondary | ICD-10-CM | POA: Diagnosis not present

## 2022-05-31 DIAGNOSIS — Z638 Other specified problems related to primary support group: Secondary | ICD-10-CM | POA: Diagnosis not present

## 2022-05-31 DIAGNOSIS — Z635 Disruption of family by separation and divorce: Secondary | ICD-10-CM | POA: Diagnosis not present

## 2022-05-31 DIAGNOSIS — Z569 Unspecified problems related to employment: Secondary | ICD-10-CM | POA: Diagnosis not present

## 2022-05-31 DIAGNOSIS — R69 Illness, unspecified: Secondary | ICD-10-CM | POA: Diagnosis not present

## 2022-06-09 DIAGNOSIS — F3181 Bipolar II disorder: Secondary | ICD-10-CM | POA: Diagnosis not present

## 2022-06-09 DIAGNOSIS — F902 Attention-deficit hyperactivity disorder, combined type: Secondary | ICD-10-CM | POA: Diagnosis not present

## 2022-06-09 DIAGNOSIS — F411 Generalized anxiety disorder: Secondary | ICD-10-CM | POA: Diagnosis not present

## 2022-06-14 DIAGNOSIS — Z635 Disruption of family by separation and divorce: Secondary | ICD-10-CM | POA: Diagnosis not present

## 2022-06-14 DIAGNOSIS — Z638 Other specified problems related to primary support group: Secondary | ICD-10-CM | POA: Diagnosis not present

## 2022-06-14 DIAGNOSIS — R69 Illness, unspecified: Secondary | ICD-10-CM | POA: Diagnosis not present

## 2022-06-14 DIAGNOSIS — Z729 Problem related to lifestyle, unspecified: Secondary | ICD-10-CM | POA: Diagnosis not present

## 2022-06-16 DIAGNOSIS — F411 Generalized anxiety disorder: Secondary | ICD-10-CM | POA: Diagnosis not present

## 2022-06-16 DIAGNOSIS — F330 Major depressive disorder, recurrent, mild: Secondary | ICD-10-CM | POA: Diagnosis not present

## 2022-06-16 DIAGNOSIS — F909 Attention-deficit hyperactivity disorder, unspecified type: Secondary | ICD-10-CM | POA: Diagnosis not present

## 2022-06-16 DIAGNOSIS — F331 Major depressive disorder, recurrent, moderate: Secondary | ICD-10-CM | POA: Diagnosis not present

## 2022-06-28 DIAGNOSIS — F341 Dysthymic disorder: Secondary | ICD-10-CM | POA: Diagnosis not present

## 2022-06-28 DIAGNOSIS — Z638 Other specified problems related to primary support group: Secondary | ICD-10-CM | POA: Diagnosis not present

## 2022-06-28 DIAGNOSIS — Z609 Problem related to social environment, unspecified: Secondary | ICD-10-CM | POA: Diagnosis not present

## 2022-07-12 DIAGNOSIS — Z569 Unspecified problems related to employment: Secondary | ICD-10-CM | POA: Diagnosis not present

## 2022-07-12 DIAGNOSIS — Z638 Other specified problems related to primary support group: Secondary | ICD-10-CM | POA: Diagnosis not present

## 2022-07-12 DIAGNOSIS — F341 Dysthymic disorder: Secondary | ICD-10-CM | POA: Diagnosis not present

## 2022-07-12 DIAGNOSIS — Z729 Problem related to lifestyle, unspecified: Secondary | ICD-10-CM | POA: Diagnosis not present

## 2022-07-14 DIAGNOSIS — F411 Generalized anxiety disorder: Secondary | ICD-10-CM | POA: Diagnosis not present

## 2022-07-14 DIAGNOSIS — F3181 Bipolar II disorder: Secondary | ICD-10-CM | POA: Diagnosis not present

## 2022-07-14 DIAGNOSIS — F902 Attention-deficit hyperactivity disorder, combined type: Secondary | ICD-10-CM | POA: Diagnosis not present

## 2022-07-21 DIAGNOSIS — Z125 Encounter for screening for malignant neoplasm of prostate: Secondary | ICD-10-CM | POA: Diagnosis not present

## 2022-07-21 DIAGNOSIS — Z6830 Body mass index (BMI) 30.0-30.9, adult: Secondary | ICD-10-CM | POA: Diagnosis not present

## 2022-07-21 DIAGNOSIS — R7303 Prediabetes: Secondary | ICD-10-CM | POA: Diagnosis not present

## 2022-07-21 DIAGNOSIS — G4733 Obstructive sleep apnea (adult) (pediatric): Secondary | ICD-10-CM | POA: Diagnosis not present

## 2022-07-21 DIAGNOSIS — Z0000 Encounter for general adult medical examination without abnormal findings: Secondary | ICD-10-CM | POA: Diagnosis not present

## 2022-07-21 DIAGNOSIS — L918 Other hypertrophic disorders of the skin: Secondary | ICD-10-CM | POA: Diagnosis not present

## 2022-07-21 DIAGNOSIS — I10 Essential (primary) hypertension: Secondary | ICD-10-CM | POA: Diagnosis not present

## 2022-07-21 DIAGNOSIS — E785 Hyperlipidemia, unspecified: Secondary | ICD-10-CM | POA: Diagnosis not present

## 2022-07-26 DIAGNOSIS — Z638 Other specified problems related to primary support group: Secondary | ICD-10-CM | POA: Diagnosis not present

## 2022-07-26 DIAGNOSIS — Z609 Problem related to social environment, unspecified: Secondary | ICD-10-CM | POA: Diagnosis not present

## 2022-07-26 DIAGNOSIS — Z635 Disruption of family by separation and divorce: Secondary | ICD-10-CM | POA: Diagnosis not present

## 2022-07-26 DIAGNOSIS — F341 Dysthymic disorder: Secondary | ICD-10-CM | POA: Diagnosis not present

## 2022-08-02 DIAGNOSIS — N200 Calculus of kidney: Secondary | ICD-10-CM | POA: Diagnosis not present

## 2022-08-02 DIAGNOSIS — R3121 Asymptomatic microscopic hematuria: Secondary | ICD-10-CM | POA: Diagnosis not present

## 2022-08-08 DIAGNOSIS — F3181 Bipolar II disorder: Secondary | ICD-10-CM | POA: Diagnosis not present

## 2022-08-08 DIAGNOSIS — F411 Generalized anxiety disorder: Secondary | ICD-10-CM | POA: Diagnosis not present

## 2022-08-08 DIAGNOSIS — G4700 Insomnia, unspecified: Secondary | ICD-10-CM | POA: Diagnosis not present

## 2022-08-08 DIAGNOSIS — F902 Attention-deficit hyperactivity disorder, combined type: Secondary | ICD-10-CM | POA: Diagnosis not present

## 2022-08-09 DIAGNOSIS — Z638 Other specified problems related to primary support group: Secondary | ICD-10-CM | POA: Diagnosis not present

## 2022-08-09 DIAGNOSIS — Z635 Disruption of family by separation and divorce: Secondary | ICD-10-CM | POA: Diagnosis not present

## 2022-08-09 DIAGNOSIS — F341 Dysthymic disorder: Secondary | ICD-10-CM | POA: Diagnosis not present

## 2022-08-09 DIAGNOSIS — Z609 Problem related to social environment, unspecified: Secondary | ICD-10-CM | POA: Diagnosis not present

## 2022-08-23 DIAGNOSIS — Z609 Problem related to social environment, unspecified: Secondary | ICD-10-CM | POA: Diagnosis not present

## 2022-08-23 DIAGNOSIS — F341 Dysthymic disorder: Secondary | ICD-10-CM | POA: Diagnosis not present

## 2022-08-30 DIAGNOSIS — F411 Generalized anxiety disorder: Secondary | ICD-10-CM | POA: Diagnosis not present

## 2022-08-30 DIAGNOSIS — F902 Attention-deficit hyperactivity disorder, combined type: Secondary | ICD-10-CM | POA: Diagnosis not present

## 2022-08-30 DIAGNOSIS — F3181 Bipolar II disorder: Secondary | ICD-10-CM | POA: Diagnosis not present

## 2022-09-06 DIAGNOSIS — Z609 Problem related to social environment, unspecified: Secondary | ICD-10-CM | POA: Diagnosis not present

## 2022-09-06 DIAGNOSIS — F341 Dysthymic disorder: Secondary | ICD-10-CM | POA: Diagnosis not present

## 2022-09-06 DIAGNOSIS — Z635 Disruption of family by separation and divorce: Secondary | ICD-10-CM | POA: Diagnosis not present

## 2022-09-08 DIAGNOSIS — F411 Generalized anxiety disorder: Secondary | ICD-10-CM | POA: Diagnosis not present

## 2022-09-08 DIAGNOSIS — F909 Attention-deficit hyperactivity disorder, unspecified type: Secondary | ICD-10-CM | POA: Diagnosis not present

## 2022-09-08 DIAGNOSIS — F331 Major depressive disorder, recurrent, moderate: Secondary | ICD-10-CM | POA: Diagnosis not present

## 2022-09-08 DIAGNOSIS — F330 Major depressive disorder, recurrent, mild: Secondary | ICD-10-CM | POA: Diagnosis not present

## 2022-09-20 DIAGNOSIS — Z638 Other specified problems related to primary support group: Secondary | ICD-10-CM | POA: Diagnosis not present

## 2022-09-20 DIAGNOSIS — F341 Dysthymic disorder: Secondary | ICD-10-CM | POA: Diagnosis not present

## 2022-09-20 DIAGNOSIS — Z609 Problem related to social environment, unspecified: Secondary | ICD-10-CM | POA: Diagnosis not present

## 2022-10-04 DIAGNOSIS — Z638 Other specified problems related to primary support group: Secondary | ICD-10-CM | POA: Diagnosis not present

## 2022-10-04 DIAGNOSIS — F341 Dysthymic disorder: Secondary | ICD-10-CM | POA: Diagnosis not present

## 2022-10-04 DIAGNOSIS — Z609 Problem related to social environment, unspecified: Secondary | ICD-10-CM | POA: Diagnosis not present

## 2022-10-18 DIAGNOSIS — Z609 Problem related to social environment, unspecified: Secondary | ICD-10-CM | POA: Diagnosis not present

## 2022-10-18 DIAGNOSIS — F341 Dysthymic disorder: Secondary | ICD-10-CM | POA: Diagnosis not present

## 2022-10-18 DIAGNOSIS — Z638 Other specified problems related to primary support group: Secondary | ICD-10-CM | POA: Diagnosis not present

## 2022-11-01 DIAGNOSIS — Z609 Problem related to social environment, unspecified: Secondary | ICD-10-CM | POA: Diagnosis not present

## 2022-11-01 DIAGNOSIS — F341 Dysthymic disorder: Secondary | ICD-10-CM | POA: Diagnosis not present

## 2022-11-07 DIAGNOSIS — Z6829 Body mass index (BMI) 29.0-29.9, adult: Secondary | ICD-10-CM | POA: Diagnosis not present

## 2022-11-07 DIAGNOSIS — L918 Other hypertrophic disorders of the skin: Secondary | ICD-10-CM | POA: Diagnosis not present

## 2022-11-07 DIAGNOSIS — Z113 Encounter for screening for infections with a predominantly sexual mode of transmission: Secondary | ICD-10-CM | POA: Diagnosis not present

## 2022-11-15 DIAGNOSIS — F341 Dysthymic disorder: Secondary | ICD-10-CM | POA: Diagnosis not present

## 2022-11-15 DIAGNOSIS — Z630 Problems in relationship with spouse or partner: Secondary | ICD-10-CM | POA: Diagnosis not present

## 2022-11-29 DIAGNOSIS — F341 Dysthymic disorder: Secondary | ICD-10-CM | POA: Diagnosis not present

## 2022-11-29 DIAGNOSIS — Z630 Problems in relationship with spouse or partner: Secondary | ICD-10-CM | POA: Diagnosis not present

## 2022-12-07 DIAGNOSIS — L409 Psoriasis, unspecified: Secondary | ICD-10-CM | POA: Diagnosis not present

## 2022-12-07 DIAGNOSIS — T63441A Toxic effect of venom of bees, accidental (unintentional), initial encounter: Secondary | ICD-10-CM | POA: Diagnosis not present

## 2022-12-07 DIAGNOSIS — F331 Major depressive disorder, recurrent, moderate: Secondary | ICD-10-CM | POA: Diagnosis not present

## 2022-12-07 DIAGNOSIS — L219 Seborrheic dermatitis, unspecified: Secondary | ICD-10-CM | POA: Diagnosis not present

## 2022-12-07 DIAGNOSIS — F909 Attention-deficit hyperactivity disorder, unspecified type: Secondary | ICD-10-CM | POA: Diagnosis not present

## 2022-12-07 DIAGNOSIS — D229 Melanocytic nevi, unspecified: Secondary | ICD-10-CM | POA: Diagnosis not present

## 2022-12-07 DIAGNOSIS — Z79899 Other long term (current) drug therapy: Secondary | ICD-10-CM | POA: Diagnosis not present

## 2022-12-07 DIAGNOSIS — F411 Generalized anxiety disorder: Secondary | ICD-10-CM | POA: Diagnosis not present

## 2022-12-07 DIAGNOSIS — F330 Major depressive disorder, recurrent, mild: Secondary | ICD-10-CM | POA: Diagnosis not present

## 2022-12-13 DIAGNOSIS — Z609 Problem related to social environment, unspecified: Secondary | ICD-10-CM | POA: Diagnosis not present

## 2022-12-13 DIAGNOSIS — Z638 Other specified problems related to primary support group: Secondary | ICD-10-CM | POA: Diagnosis not present

## 2022-12-13 DIAGNOSIS — F341 Dysthymic disorder: Secondary | ICD-10-CM | POA: Diagnosis not present

## 2022-12-13 DIAGNOSIS — H52221 Regular astigmatism, right eye: Secondary | ICD-10-CM | POA: Diagnosis not present

## 2022-12-27 DIAGNOSIS — F341 Dysthymic disorder: Secondary | ICD-10-CM | POA: Diagnosis not present

## 2022-12-27 DIAGNOSIS — Z609 Problem related to social environment, unspecified: Secondary | ICD-10-CM | POA: Diagnosis not present

## 2022-12-27 DIAGNOSIS — Z638 Other specified problems related to primary support group: Secondary | ICD-10-CM | POA: Diagnosis not present

## 2023-01-10 DIAGNOSIS — Z638 Other specified problems related to primary support group: Secondary | ICD-10-CM | POA: Diagnosis not present

## 2023-01-10 DIAGNOSIS — Z609 Problem related to social environment, unspecified: Secondary | ICD-10-CM | POA: Diagnosis not present

## 2023-01-10 DIAGNOSIS — Z569 Unspecified problems related to employment: Secondary | ICD-10-CM | POA: Diagnosis not present

## 2023-01-10 DIAGNOSIS — F341 Dysthymic disorder: Secondary | ICD-10-CM | POA: Diagnosis not present

## 2023-01-18 DIAGNOSIS — B354 Tinea corporis: Secondary | ICD-10-CM | POA: Diagnosis not present

## 2023-01-24 DIAGNOSIS — F341 Dysthymic disorder: Secondary | ICD-10-CM | POA: Diagnosis not present

## 2023-01-24 DIAGNOSIS — Z599 Problem related to housing and economic circumstances, unspecified: Secondary | ICD-10-CM | POA: Diagnosis not present

## 2023-01-24 DIAGNOSIS — Z609 Problem related to social environment, unspecified: Secondary | ICD-10-CM | POA: Diagnosis not present

## 2023-02-02 DIAGNOSIS — E559 Vitamin D deficiency, unspecified: Secondary | ICD-10-CM | POA: Diagnosis not present

## 2023-02-02 DIAGNOSIS — E785 Hyperlipidemia, unspecified: Secondary | ICD-10-CM | POA: Diagnosis not present

## 2023-02-02 DIAGNOSIS — F4329 Adjustment disorder with other symptoms: Secondary | ICD-10-CM | POA: Diagnosis not present

## 2023-02-02 DIAGNOSIS — I10 Essential (primary) hypertension: Secondary | ICD-10-CM | POA: Diagnosis not present

## 2023-02-03 DIAGNOSIS — G4733 Obstructive sleep apnea (adult) (pediatric): Secondary | ICD-10-CM | POA: Diagnosis not present

## 2023-02-07 DIAGNOSIS — Z599 Problem related to housing and economic circumstances, unspecified: Secondary | ICD-10-CM | POA: Diagnosis not present

## 2023-02-07 DIAGNOSIS — Z609 Problem related to social environment, unspecified: Secondary | ICD-10-CM | POA: Diagnosis not present

## 2023-02-07 DIAGNOSIS — F341 Dysthymic disorder: Secondary | ICD-10-CM | POA: Diagnosis not present

## 2023-02-21 DIAGNOSIS — F341 Dysthymic disorder: Secondary | ICD-10-CM | POA: Diagnosis not present

## 2023-02-21 DIAGNOSIS — Z569 Unspecified problems related to employment: Secondary | ICD-10-CM | POA: Diagnosis not present

## 2023-02-21 DIAGNOSIS — Z653 Problems related to other legal circumstances: Secondary | ICD-10-CM | POA: Diagnosis not present

## 2023-03-01 DIAGNOSIS — F411 Generalized anxiety disorder: Secondary | ICD-10-CM | POA: Diagnosis not present

## 2023-03-01 DIAGNOSIS — F331 Major depressive disorder, recurrent, moderate: Secondary | ICD-10-CM | POA: Diagnosis not present

## 2023-03-01 DIAGNOSIS — F330 Major depressive disorder, recurrent, mild: Secondary | ICD-10-CM | POA: Diagnosis not present

## 2023-03-01 DIAGNOSIS — F909 Attention-deficit hyperactivity disorder, unspecified type: Secondary | ICD-10-CM | POA: Diagnosis not present

## 2023-03-07 DIAGNOSIS — F341 Dysthymic disorder: Secondary | ICD-10-CM | POA: Diagnosis not present

## 2023-03-07 DIAGNOSIS — Z5986 Financial insecurity: Secondary | ICD-10-CM | POA: Diagnosis not present

## 2023-03-07 DIAGNOSIS — Z653 Problems related to other legal circumstances: Secondary | ICD-10-CM | POA: Diagnosis not present

## 2023-03-21 DIAGNOSIS — F341 Dysthymic disorder: Secondary | ICD-10-CM | POA: Diagnosis not present

## 2023-03-21 DIAGNOSIS — Z638 Other specified problems related to primary support group: Secondary | ICD-10-CM | POA: Diagnosis not present

## 2023-03-21 DIAGNOSIS — Z609 Problem related to social environment, unspecified: Secondary | ICD-10-CM | POA: Diagnosis not present

## 2023-04-04 DIAGNOSIS — Z609 Problem related to social environment, unspecified: Secondary | ICD-10-CM | POA: Diagnosis not present

## 2023-04-04 DIAGNOSIS — F341 Dysthymic disorder: Secondary | ICD-10-CM | POA: Diagnosis not present

## 2023-04-04 DIAGNOSIS — Z653 Problems related to other legal circumstances: Secondary | ICD-10-CM | POA: Diagnosis not present

## 2023-04-10 DIAGNOSIS — F341 Dysthymic disorder: Secondary | ICD-10-CM | POA: Diagnosis not present

## 2023-04-10 DIAGNOSIS — Z609 Problem related to social environment, unspecified: Secondary | ICD-10-CM | POA: Diagnosis not present

## 2023-04-10 DIAGNOSIS — Z653 Problems related to other legal circumstances: Secondary | ICD-10-CM | POA: Diagnosis not present

## 2023-04-18 DIAGNOSIS — F341 Dysthymic disorder: Secondary | ICD-10-CM | POA: Diagnosis not present

## 2023-04-18 DIAGNOSIS — Z653 Problems related to other legal circumstances: Secondary | ICD-10-CM | POA: Diagnosis not present

## 2023-04-18 DIAGNOSIS — Z609 Problem related to social environment, unspecified: Secondary | ICD-10-CM | POA: Diagnosis not present

## 2023-05-02 DIAGNOSIS — Z653 Problems related to other legal circumstances: Secondary | ICD-10-CM | POA: Diagnosis not present

## 2023-05-02 DIAGNOSIS — F341 Dysthymic disorder: Secondary | ICD-10-CM | POA: Diagnosis not present

## 2023-05-02 DIAGNOSIS — Z609 Problem related to social environment, unspecified: Secondary | ICD-10-CM | POA: Diagnosis not present

## 2023-05-07 DIAGNOSIS — G4733 Obstructive sleep apnea (adult) (pediatric): Secondary | ICD-10-CM | POA: Diagnosis not present

## 2023-05-11 DIAGNOSIS — B359 Dermatophytosis, unspecified: Secondary | ICD-10-CM | POA: Diagnosis not present

## 2023-05-11 DIAGNOSIS — L720 Epidermal cyst: Secondary | ICD-10-CM | POA: Diagnosis not present

## 2023-05-16 DIAGNOSIS — Z635 Disruption of family by separation and divorce: Secondary | ICD-10-CM | POA: Diagnosis not present

## 2023-05-16 DIAGNOSIS — Z609 Problem related to social environment, unspecified: Secondary | ICD-10-CM | POA: Diagnosis not present

## 2023-05-16 DIAGNOSIS — F341 Dysthymic disorder: Secondary | ICD-10-CM | POA: Diagnosis not present

## 2023-05-24 DIAGNOSIS — F909 Attention-deficit hyperactivity disorder, unspecified type: Secondary | ICD-10-CM | POA: Diagnosis not present

## 2023-05-24 DIAGNOSIS — F331 Major depressive disorder, recurrent, moderate: Secondary | ICD-10-CM | POA: Diagnosis not present

## 2023-05-24 DIAGNOSIS — F411 Generalized anxiety disorder: Secondary | ICD-10-CM | POA: Diagnosis not present

## 2023-05-24 DIAGNOSIS — F330 Major depressive disorder, recurrent, mild: Secondary | ICD-10-CM | POA: Diagnosis not present

## 2023-06-12 DIAGNOSIS — L409 Psoriasis, unspecified: Secondary | ICD-10-CM | POA: Diagnosis not present

## 2023-06-12 DIAGNOSIS — Z79899 Other long term (current) drug therapy: Secondary | ICD-10-CM | POA: Diagnosis not present

## 2023-06-13 DIAGNOSIS — Z729 Problem related to lifestyle, unspecified: Secondary | ICD-10-CM | POA: Diagnosis not present

## 2023-06-13 DIAGNOSIS — F341 Dysthymic disorder: Secondary | ICD-10-CM | POA: Diagnosis not present

## 2023-06-13 DIAGNOSIS — Z653 Problems related to other legal circumstances: Secondary | ICD-10-CM | POA: Diagnosis not present

## 2023-06-27 DIAGNOSIS — F341 Dysthymic disorder: Secondary | ICD-10-CM | POA: Diagnosis not present

## 2023-06-27 DIAGNOSIS — Z653 Problems related to other legal circumstances: Secondary | ICD-10-CM | POA: Diagnosis not present

## 2023-06-27 DIAGNOSIS — Z638 Other specified problems related to primary support group: Secondary | ICD-10-CM | POA: Diagnosis not present

## 2023-07-11 DIAGNOSIS — Z569 Unspecified problems related to employment: Secondary | ICD-10-CM | POA: Diagnosis not present

## 2023-07-11 DIAGNOSIS — F341 Dysthymic disorder: Secondary | ICD-10-CM | POA: Diagnosis not present

## 2023-07-11 DIAGNOSIS — Z638 Other specified problems related to primary support group: Secondary | ICD-10-CM | POA: Diagnosis not present

## 2023-07-11 DIAGNOSIS — Z653 Problems related to other legal circumstances: Secondary | ICD-10-CM | POA: Diagnosis not present

## 2023-08-06 DIAGNOSIS — G4733 Obstructive sleep apnea (adult) (pediatric): Secondary | ICD-10-CM | POA: Diagnosis not present

## 2023-08-08 DIAGNOSIS — F341 Dysthymic disorder: Secondary | ICD-10-CM | POA: Diagnosis not present

## 2023-08-08 DIAGNOSIS — Z638 Other specified problems related to primary support group: Secondary | ICD-10-CM | POA: Diagnosis not present

## 2023-08-08 DIAGNOSIS — Z569 Unspecified problems related to employment: Secondary | ICD-10-CM | POA: Diagnosis not present

## 2023-08-21 DIAGNOSIS — F411 Generalized anxiety disorder: Secondary | ICD-10-CM | POA: Diagnosis not present

## 2023-08-21 DIAGNOSIS — F330 Major depressive disorder, recurrent, mild: Secondary | ICD-10-CM | POA: Diagnosis not present

## 2023-08-21 DIAGNOSIS — F909 Attention-deficit hyperactivity disorder, unspecified type: Secondary | ICD-10-CM | POA: Diagnosis not present

## 2023-08-22 DIAGNOSIS — Z638 Other specified problems related to primary support group: Secondary | ICD-10-CM | POA: Diagnosis not present

## 2023-08-22 DIAGNOSIS — Z729 Problem related to lifestyle, unspecified: Secondary | ICD-10-CM | POA: Diagnosis not present

## 2023-08-22 DIAGNOSIS — F341 Dysthymic disorder: Secondary | ICD-10-CM | POA: Diagnosis not present

## 2023-09-05 DIAGNOSIS — F341 Dysthymic disorder: Secondary | ICD-10-CM | POA: Diagnosis not present

## 2023-09-05 DIAGNOSIS — Z569 Unspecified problems related to employment: Secondary | ICD-10-CM | POA: Diagnosis not present

## 2023-09-05 DIAGNOSIS — Z729 Problem related to lifestyle, unspecified: Secondary | ICD-10-CM | POA: Diagnosis not present

## 2023-09-05 DIAGNOSIS — Z638 Other specified problems related to primary support group: Secondary | ICD-10-CM | POA: Diagnosis not present

## 2023-09-07 DIAGNOSIS — R3121 Asymptomatic microscopic hematuria: Secondary | ICD-10-CM | POA: Diagnosis not present

## 2023-09-07 DIAGNOSIS — N200 Calculus of kidney: Secondary | ICD-10-CM | POA: Diagnosis not present

## 2023-09-07 DIAGNOSIS — D4102 Neoplasm of uncertain behavior of left kidney: Secondary | ICD-10-CM | POA: Diagnosis not present

## 2023-09-17 ENCOUNTER — Other Ambulatory Visit: Payer: Self-pay | Admitting: Urology

## 2023-09-17 ENCOUNTER — Encounter: Payer: Self-pay | Admitting: Urology

## 2023-09-17 DIAGNOSIS — D4102 Neoplasm of uncertain behavior of left kidney: Secondary | ICD-10-CM

## 2023-12-07 ENCOUNTER — Ambulatory Visit
Admission: RE | Admit: 2023-12-07 | Discharge: 2023-12-07 | Disposition: A | Discharge status: Home / Self Care | Source: Ambulatory Visit | Attending: Urology | Admitting: Urology

## 2023-12-07 DIAGNOSIS — D4102 Neoplasm of uncertain behavior of left kidney: Secondary | ICD-10-CM

## 2023-12-07 MED ORDER — GADOPICLENOL 0.5 MMOL/ML IV SOLN
7.5000 mL | Freq: Once | INTRAVENOUS | Status: AC | PRN
  Administered 2023-12-07: 7.5 mL via INTRAVENOUS
